# Patient Record
Sex: Female | Born: 1955 | Race: Black or African American | Hispanic: No | Marital: Married | Smoking: Never smoker
Health system: Southern US, Community
[De-identification: ages and names within clinical notes are randomized; demographics above are authoritative.]

## PROBLEM LIST (undated history)

## (undated) DIAGNOSIS — R7303 Prediabetes: Secondary | ICD-10-CM

## (undated) DIAGNOSIS — Z803 Family history of malignant neoplasm of breast: Secondary | ICD-10-CM

## (undated) DIAGNOSIS — T4145XA Adverse effect of unspecified anesthetic, initial encounter: Secondary | ICD-10-CM

## (undated) DIAGNOSIS — I739 Peripheral vascular disease, unspecified: Secondary | ICD-10-CM

## (undated) DIAGNOSIS — R519 Headache, unspecified: Secondary | ICD-10-CM

## (undated) DIAGNOSIS — E559 Vitamin D deficiency, unspecified: Secondary | ICD-10-CM

## (undated) DIAGNOSIS — H04123 Dry eye syndrome of bilateral lacrimal glands: Secondary | ICD-10-CM

## (undated) DIAGNOSIS — M1990 Unspecified osteoarthritis, unspecified site: Secondary | ICD-10-CM

## (undated) DIAGNOSIS — I2510 Atherosclerotic heart disease of native coronary artery without angina pectoris: Secondary | ICD-10-CM

## (undated) DIAGNOSIS — R0602 Shortness of breath: Secondary | ICD-10-CM

## (undated) DIAGNOSIS — E538 Deficiency of other specified B group vitamins: Secondary | ICD-10-CM

## (undated) DIAGNOSIS — M7989 Other specified soft tissue disorders: Secondary | ICD-10-CM

## (undated) DIAGNOSIS — F419 Anxiety disorder, unspecified: Secondary | ICD-10-CM

## (undated) DIAGNOSIS — Z800 Family history of malignant neoplasm of digestive organs: Secondary | ICD-10-CM

## (undated) DIAGNOSIS — G479 Sleep disorder, unspecified: Secondary | ICD-10-CM

## (undated) DIAGNOSIS — R112 Nausea with vomiting, unspecified: Secondary | ICD-10-CM

## (undated) DIAGNOSIS — R51 Headache: Secondary | ICD-10-CM

## (undated) DIAGNOSIS — L309 Dermatitis, unspecified: Secondary | ICD-10-CM

## (undated) DIAGNOSIS — K219 Gastro-esophageal reflux disease without esophagitis: Secondary | ICD-10-CM

## (undated) DIAGNOSIS — F329 Major depressive disorder, single episode, unspecified: Secondary | ICD-10-CM

## (undated) DIAGNOSIS — K5900 Constipation, unspecified: Secondary | ICD-10-CM

## (undated) DIAGNOSIS — F32A Depression, unspecified: Secondary | ICD-10-CM

## (undated) DIAGNOSIS — J189 Pneumonia, unspecified organism: Secondary | ICD-10-CM

## (undated) DIAGNOSIS — Z9889 Other specified postprocedural states: Secondary | ICD-10-CM

## (undated) DIAGNOSIS — E7800 Pure hypercholesterolemia, unspecified: Secondary | ICD-10-CM

## (undated) DIAGNOSIS — T8859XA Other complications of anesthesia, initial encounter: Secondary | ICD-10-CM

## (undated) HISTORY — DX: Constipation, unspecified: K59.00

## (undated) HISTORY — DX: Other specified soft tissue disorders: M79.89

## (undated) HISTORY — PX: OTHER SURGICAL HISTORY: SHX169

## (undated) HISTORY — DX: Deficiency of other specified B group vitamins: E53.8

## (undated) HISTORY — DX: Depression, unspecified: F32.A

## (undated) HISTORY — DX: Family history of malignant neoplasm of breast: Z80.3

## (undated) HISTORY — DX: Sleep disorder, unspecified: G47.9

## (undated) HISTORY — DX: Major depressive disorder, single episode, unspecified: F32.9

## (undated) HISTORY — DX: Prediabetes: R73.03

## (undated) HISTORY — DX: Unspecified osteoarthritis, unspecified site: M19.90

## (undated) HISTORY — DX: Vitamin D deficiency, unspecified: E55.9

## (undated) HISTORY — DX: Atherosclerotic heart disease of native coronary artery without angina pectoris: I25.10

## (undated) HISTORY — DX: Shortness of breath: R06.02

## (undated) HISTORY — DX: Family history of malignant neoplasm of digestive organs: Z80.0

## (undated) HISTORY — PX: KNEE SURGERY: SHX244

---

## 2005-10-24 ENCOUNTER — Other Ambulatory Visit: Admission: RE | Admit: 2005-10-24 | Discharge: 2005-10-24 | Payer: Self-pay | Admitting: Obstetrics and Gynecology

## 2005-11-09 ENCOUNTER — Encounter: Admission: RE | Admit: 2005-11-09 | Discharge: 2005-11-09 | Payer: Self-pay | Admitting: Obstetrics and Gynecology

## 2005-12-15 ENCOUNTER — Ambulatory Visit (HOSPITAL_COMMUNITY): Admission: RE | Admit: 2005-12-15 | Discharge: 2005-12-15 | Payer: Self-pay | Admitting: Obstetrics and Gynecology

## 2010-04-10 ENCOUNTER — Encounter: Payer: Self-pay | Admitting: Obstetrics and Gynecology

## 2010-05-30 ENCOUNTER — Other Ambulatory Visit (HOSPITAL_COMMUNITY)
Admission: RE | Admit: 2010-05-30 | Discharge: 2010-05-30 | Disposition: A | Discharge status: Home / Self Care | Payer: 59 | Source: Ambulatory Visit | Attending: Obstetrics and Gynecology | Admitting: Obstetrics and Gynecology

## 2010-05-30 ENCOUNTER — Other Ambulatory Visit: Payer: Self-pay | Admitting: Obstetrics and Gynecology

## 2010-05-30 DIAGNOSIS — Z01419 Encounter for gynecological examination (general) (routine) without abnormal findings: Secondary | ICD-10-CM | POA: Insufficient documentation

## 2011-01-13 ENCOUNTER — Encounter: Payer: Self-pay | Admitting: Family Medicine

## 2011-01-13 ENCOUNTER — Ambulatory Visit (INDEPENDENT_AMBULATORY_CARE_PROVIDER_SITE_OTHER): Payer: 59 | Admitting: Family Medicine

## 2011-01-13 ENCOUNTER — Ambulatory Visit: Payer: 59 | Admitting: Family Medicine

## 2011-01-13 VITALS — BP 113/66 | HR 77 | Temp 97.8°F | Ht 68.0 in | Wt 243.0 lb

## 2011-01-13 DIAGNOSIS — S99919A Unspecified injury of unspecified ankle, initial encounter: Secondary | ICD-10-CM

## 2011-01-13 DIAGNOSIS — S99912A Unspecified injury of left ankle, initial encounter: Secondary | ICD-10-CM

## 2011-01-13 DIAGNOSIS — S8990XA Unspecified injury of unspecified lower leg, initial encounter: Secondary | ICD-10-CM

## 2011-01-13 NOTE — Patient Instructions (Signed)
Your exam is most consistent with a medial ankle sprain, posterior tibialis strain/tendinopathy, stretch of tibial nerve on the inside part of your ankle that has not been rehabilitated to this point. Start physical therapy for exercises and modalities. Icing 15 minutes at a time 3-4 times a day. Try over the counter insoles with arch support to alleviate overpronation that causes strain of these areas. Aleve 1-2 tabs twice a day with food for pain and inflammation. A walking boot or lace-up ankle brace are considerations but I would not recommend those at this point. If you are not improving over the course of 1 month to 6 weeks, at that point I would move forward with an MRI to further assess. Follow- up with me in 1 month to 6 weeks for a recheck.

## 2011-01-16 ENCOUNTER — Encounter: Payer: Self-pay | Admitting: Family Medicine

## 2011-01-16 DIAGNOSIS — S99912A Unspecified injury of left ankle, initial encounter: Secondary | ICD-10-CM | POA: Insufficient documentation

## 2011-01-16 NOTE — Assessment & Plan Note (Signed)
Initial injury 6 months ago and patient still having medial pain though she has not rehabilitated this injury.  Initial x-rays negative and today's ultrasound do not show evidence of fracture or a nonunion.  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  Start physical therapy, arch supports, aleve, icing.  No gross instability so will hold off on using boot or ASO with focus on PT.  F/u in 1 month to 6 weeks.  If not improving would consider MRI to further assess persistent swelling, pain.

## 2011-01-16 NOTE — Progress Notes (Signed)
  Subjective:    Patient ID: Destiny Rocha, female    DOB: 03-28-1955, 55 y.o.   MRN: [id number]  PCP: Dr. Isabella Stalling  HPI 55 yo F here for left ankle injury.  Patient reports in April of this year she jumped out of the bed to catch her husband who was passing out when she everted her left ankle. She had difficulty bearing weight immediately following this. Sought care and had x-rays at [facility] that were negative for a fracture. Since took some anti-inflammatories, has been icing. Doing some range of motion exercises but has not done formal PT. + swelling Feels a burning sensation medial left ankle at times and knots in this area. No prior ankle injuries.  History reviewed. No pertinent past medical history.  No current outpatient prescriptions on file prior to visit.    Past Surgical History  Procedure Date  . Knee surgery     No Known Allergies  History   Social History  . Marital Status: Married    Spouse Name: N/A    Number of Children: N/A  . Years of Education: N/A   Occupational History  . Not on file.   Social History Main Topics  . Smoking status: Never Smoker   . Smokeless tobacco: Not on file  . Alcohol Use: Not on file  . Drug Use: Not on file  . Sexually Active: Not on file   Other Topics Concern  . Not on file   Social History Narrative  . No narrative on file    No family history on file.  BP 113/66  Pulse 77  Temp(Src) 97.8 F (36.6 C) (Oral)  Ht 5\' 8"  (1.727 m)  Wt 243 lb (110.224 kg)  BMI 36.95 kg/m2  Review of Systems See HPI above.    Objective:   Physical Exam Gen: NAD  L ankle: Mild edema throughout ankle but similar to right ankle.  No other gross deformity, ecchymoses. FROM with pain on full external and internal rotation - pain in medial ankle. 4/5 strength with IR, 5/5 all other motions. TTP posterior aspect medial malleolus, through post tib tendon course, and around deltoid ligament.  No lat malleolus, base 5th,  navicular, fibular head, or other TTP about ankle/lower leg. Negative ant drawer and talar tilt.   Negative syndesmotic compression. Thompsons test negative. NV intact distally. Negative tinels tarsal tunnel.  MSK u/s: No evidence bony abnormality, cortical edema, or neovascularity over bony cortex of medial/lateral malleoli, base 5th MT.  Peroneal tendons and posterior tibialis tendon appear intact without partial tears or neovascularity.  Talar dome appears normal.    Assessment & Plan:  1. Left ankle injury - Initial injury 6 months ago and patient still having medial pain though she has not rehabilitated this injury.  Initial x-rays negative and today's ultrasound do not show evidence of fracture or a nonunion.  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  Start physical therapy, arch supports, aleve, icing.  No gross instability so will hold off on using boot or ASO with focus on PT.  F/u in 1 month to 6 weeks.  If not improving would consider MRI to further assess persistent swelling, pain.

## 2011-11-23 ENCOUNTER — Emergency Department (HOSPITAL_COMMUNITY)
Admission: EM | Admit: 2011-11-23 | Discharge: 2011-11-24 | Disposition: A | Discharge status: Home / Self Care | Payer: 59 | Attending: Emergency Medicine | Admitting: Emergency Medicine

## 2011-11-23 ENCOUNTER — Emergency Department (HOSPITAL_COMMUNITY): Payer: 59

## 2011-11-23 DIAGNOSIS — R0789 Other chest pain: Secondary | ICD-10-CM

## 2011-11-23 DIAGNOSIS — R079 Chest pain, unspecified: Secondary | ICD-10-CM | POA: Insufficient documentation

## 2011-11-23 MED ORDER — KETOROLAC TROMETHAMINE 30 MG/ML IJ SOLN: 30.0000 mg | Freq: Once | INTRAMUSCULAR | Status: DC

## 2011-11-23 NOTE — ED Provider Notes (Signed)
History     CSN: [id number]  Arrival date & time 11/23/11  2256   First MD Initiated Contact with Patient 11/23/11 2320      Chief Complaint  Patient presents with  . Chest Pain    (Consider location/radiation/quality/duration/timing/severity/associated sxs/prior treatment) Patient is a 56 y.o. female presenting with chest pain. The history is provided by the patient.  Chest Pain The chest pain began yesterday. Duration of episode(s) is 13 hours. Chest pain occurs constantly. The chest pain is unchanged. Associated with: being still. At its most intense, the pain is at 9/10. The pain is currently at 9/10. The severity of the pain is severe. The quality of the pain is described as dull. Radiates to: movement makes pain better. Exacerbated by: nothing. Primary symptoms include cough. Pertinent negatives for primary symptoms include no fever, no fatigue, no nausea, no vomiting and no dizziness.  Pertinent negatives for associated symptoms include no claudication, no diaphoresis and no weakness. She tried aspirin for the symptoms. Risk factors include obesity.  Pertinent negatives for past medical history include no MI.  Her family medical history is significant for CAD in family.  Procedure history is negative for cardiac catheterization.     No past medical history on file.  Past Surgical History  Procedure Date  . Knee surgery     No family history on file.  History  Substance Use Topics  . Smoking status: Never Smoker   . Smokeless tobacco: Not on file  . Alcohol Use: Not on file    OB History    Grav Para Term Preterm Abortions TAB SAB Ect Mult Living                  Review of Systems  Constitutional: Negative for fever, diaphoresis and fatigue.  HENT: Negative for neck pain.   Respiratory: Positive for cough.   Cardiovascular: Positive for chest pain. Negative for claudication and leg swelling.  Gastrointestinal: Negative for nausea and vomiting.  Neurological:  Negative for dizziness and weakness.  All other systems reviewed and are negative.    Allergies  Review of patient's allergies indicates no known allergies.  Home Medications  No current outpatient prescriptions on file.  BP 126/78  Pulse 75  Resp 16  Ht 5\' 8"  (1.727 m)  Wt 250 lb (113.399 kg)  BMI 38.01 kg/m2  SpO2 100%  Physical Exam  Constitutional: She is oriented to person, place, and time. She appears well-developed and well-nourished. No distress.  HENT:  Head: Normocephalic and atraumatic.  Mouth/Throat: Oropharynx is clear and moist.  Eyes: EOM are normal. Pupils are equal, round, and reactive to light.  Neck: Normal range of motion. Neck supple.  Cardiovascular: Normal rate and regular rhythm.   Pulmonary/Chest: Effort normal and breath sounds normal. She has no wheezes. She has no rales. She exhibits tenderness.  Abdominal: Soft. Bowel sounds are normal. There is no tenderness. There is no rebound and no guarding.  Musculoskeletal: Normal range of motion.  Neurological: She is alert and oriented to person, place, and time.  Skin: Skin is warm and dry.  Psychiatric: She has a normal mood and affect.    ED Course  Procedures (including critical care time)   Labs Reviewed  CBC WITH DIFFERENTIAL  D-DIMER, QUANTITATIVE   No results found.   No diagnosis found.    MDM   Date: 11/23/2011  Rate:69  Rhythm: normal sinus rhythm  QRS Axis: normal  Intervals: normal  ST/T Wave abnormalities: normal  Conduction Disutrbances: none  Narrative Interpretation: unremarkable     Doubt cardiac etiology, appear musculoskeletal as pain is reproducible and better with motion.  Moreover in the setting of > 8 hours of continuous pain with one negative EKG and one negative troponin is sufficient to exclude acs.  Follow up with your family doctor return for ongoing care      Yalda Herd K Boy Delamater-Rasch, MD 11/24/11 0126

## 2011-11-23 NOTE — ED Notes (Signed)
Pt reports mid chest pain since last night. Pain radiates to upper back. Pt reports shortness of breath, mild sweating at night. Denies n/v.

## 2011-11-24 ENCOUNTER — Encounter (HOSPITAL_COMMUNITY): Payer: Self-pay

## 2011-11-24 LAB — POCT I-STAT, CHEM 8
Calcium, Ion: 1.13 mmol/L (ref 1.12–1.23)
Glucose, Bld: 98 mg/dL (ref 70–99)
HCT: 40 % (ref 36.0–46.0)
Hemoglobin: 13.6 g/dL (ref 12.0–15.0)
Potassium: 4.1 mEq/L (ref 3.5–5.1)

## 2011-11-24 LAB — CBC WITH DIFFERENTIAL/PLATELET
Basophils Absolute: 0 10*3/uL (ref 0.0–0.1)
Basophils Relative: 0 % (ref 0–1)
Eosinophils Relative: 2 % (ref 0–5)
HCT: 38.8 % (ref 36.0–46.0)
MCHC: 35.3 g/dL (ref 30.0–36.0)
Monocytes Absolute: 0.5 10*3/uL (ref 0.1–1.0)
Neutro Abs: 3.1 10*3/uL (ref 1.7–7.7)
Platelets: 247 10*3/uL (ref 150–400)
RDW: 13.4 % (ref 11.5–15.5)

## 2011-11-24 MED ORDER — TRAMADOL HCL 50 MG PO TABS: 50.0000 mg | ORAL_TABLET | Freq: Four times a day (QID) | ORAL | Status: DC | PRN

## 2011-11-24 MED ORDER — IBUPROFEN 600 MG PO TABS: 600.0000 mg | ORAL_TABLET | Freq: Four times a day (QID) | ORAL | Status: AC | PRN

## 2011-11-24 NOTE — ED Notes (Signed)
Pt refused tramadol.  Went back to md.  Pt given script for ibuprofen.  Pt refused ibuprofen.  States she will go to primary md.  Neither med an allergy.  Pt states does not work for her pain and may cause GI upset.

## 2011-12-27 ENCOUNTER — Other Ambulatory Visit: Payer: Self-pay | Admitting: Family Medicine

## 2011-12-27 DIAGNOSIS — M79609 Pain in unspecified limb: Secondary | ICD-10-CM

## 2011-12-29 ENCOUNTER — Ambulatory Visit
Admission: RE | Admit: 2011-12-29 | Discharge: 2011-12-29 | Disposition: A | Discharge status: Home / Self Care | Payer: 59 | Source: Ambulatory Visit | Attending: Family Medicine | Admitting: Family Medicine

## 2011-12-29 ENCOUNTER — Other Ambulatory Visit: Payer: 59

## 2011-12-29 DIAGNOSIS — M79609 Pain in unspecified limb: Secondary | ICD-10-CM

## 2013-09-08 ENCOUNTER — Encounter: Payer: Self-pay | Admitting: Sports Medicine

## 2013-09-08 ENCOUNTER — Ambulatory Visit (INDEPENDENT_AMBULATORY_CARE_PROVIDER_SITE_OTHER): Payer: Managed Care, Other (non HMO) | Admitting: Sports Medicine

## 2013-09-08 VITALS — BP 123/82 | HR 84 | Ht 68.0 in | Wt 268.8 lb

## 2013-09-08 DIAGNOSIS — M170 Bilateral primary osteoarthritis of knee: Secondary | ICD-10-CM | POA: Insufficient documentation

## 2013-09-08 DIAGNOSIS — M1710 Unilateral primary osteoarthritis, unspecified knee: Secondary | ICD-10-CM

## 2013-09-08 MED ORDER — MELOXICAM 15 MG PO TABS: ORAL_TABLET | ORAL | Status: DC

## 2013-09-08 NOTE — Assessment & Plan Note (Addendum)
Patient does desire to proceed conservatively. She can do our free fitness programs downstairs, she is thinking about pursuing weight loss management either with me, with the bariatric center up the road which I think is acceptable. If she decides to proceed with [name] I am happy to prescribe phentermine, give her an exercise prescription, and have her see a nutritionist. I have also offered custom orthotics. She will think about this and return to see me as needed. Injection would be the next step. She will take Mobic daily.

## 2013-09-08 NOTE — Assessment & Plan Note (Signed)
We did discuss her current weight loss plans, she is seeing a bariatric center and getting phentermine. I advised her I be happy to add nutrition referral, and that there were exercise classes for free downstairs. She will let me know whether she would like to utilize any of these services or would want me to take over prescription of her phentermine.

## 2013-09-08 NOTE — Progress Notes (Signed)
   Subjective:    I'm seeing this patient as a consultation for:  Dr. Orland Penman  CC: Bilateral knee pain  HPI: This is a pleasant 58 year old female with a long history of knee osteoarthritis, she is morbidly obese. Pain is moderate, persistent, localized in the posterior medial joint line. She has been told by other providers that she has a Child psychotherapist cyst, but this is never been confirmed with imaging. Pain is moderate, persistent, no mechanical symptoms.  Past medical history, Surgical history, Family history not pertinant except as noted below, Social history, Allergies, and medications have been entered into the medical record, reviewed, and no changes needed.   Review of Systems: No headache, visual changes, nausea, vomiting, diarrhea, constipation, dizziness, abdominal pain, skin rash, fevers, chills, night sweats, weight loss, swollen lymph nodes, body aches, joint swelling, muscle aches, chest pain, shortness of breath, mood changes, visual or auditory hallucinations.   Objective:   General: Well Developed, well nourished, and in no acute distress.  Neuro/Psych: Alert and oriented x3, extra-ocular muscles intact, able to move all 4 extremities, sensation grossly intact. Skin: Warm and dry, no rashes noted.  Respiratory: Not using accessory muscles, speaking in full sentences, trachea midline.  Cardiovascular: Pulses palpable, no extremity edema. Abdomen: Does not appear distended. Bilateral Knee: Normal to inspection with no erythema or effusion or obvious bony abnormalities. Palpation at the joint lines medially on both knees. ROM full in flexion and extension and lower leg rotation. Ligaments with solid consistent endpoints including ACL, PCL, LCL, MCL. Negative Mcmurray's, Apley's, and Thessalonian tests. Non painful patellar compression. Patellar glide without crepitus. Patellar and quadriceps tendons unremarkable. Hamstring and quadriceps strength is normal.   Impression and  Recommendations:   This case required medical decision making of moderate complexity.

## 2013-09-16 ENCOUNTER — Other Ambulatory Visit: Payer: Self-pay | Admitting: Radiology

## 2013-10-06 ENCOUNTER — Encounter (INDEPENDENT_AMBULATORY_CARE_PROVIDER_SITE_OTHER): Payer: Self-pay | Admitting: General Surgery

## 2013-10-06 ENCOUNTER — Ambulatory Visit (INDEPENDENT_AMBULATORY_CARE_PROVIDER_SITE_OTHER): Payer: Managed Care, Other (non HMO) | Admitting: General Surgery

## 2013-10-06 VITALS — BP 116/80 | HR 80 | Resp 16 | Ht 68.0 in | Wt 267.2 lb

## 2013-10-06 DIAGNOSIS — D242 Benign neoplasm of left breast: Secondary | ICD-10-CM

## 2013-10-06 DIAGNOSIS — D249 Benign neoplasm of unspecified breast: Secondary | ICD-10-CM

## 2013-10-06 NOTE — Progress Notes (Addendum)
Patient ID: Destiny Rocha, female   DOB: 1955-12-21, 58 y.o.   MRN: [id number]  Chief Complaint  Patient presents with  . Breast Mass    new pt- eval papilloma    HPI Destiny Rocha is a 58 y.o. female.   HPI  She is referred by Dr. Marcelo Baldy for further evaluation and treatment of a left breast intraductal papilloma.  She was noted to have an abnormality in the left breast on a screening mammogram. A 9 mm mass was noted deep to the nipple. Image guided biopsy was performed. Pathology demonstrated an intraductal papilloma with no atypia. No family history of breast cancer. Age at menarche was 35. Age at first live childbirth was 89. Menopause at age 41. No hormone replacement therapy.  Her husband is here with her.  Past Medical History  Diagnosis Date  . Arthritis   . Osteoporosis     Past Surgical History  Procedure Laterality Date  . Knee surgery    . Cesarean section      History reviewed. No pertinent family history.  Social History History  Substance Use Topics  . Smoking status: Never Smoker   . Smokeless tobacco: Not on file  . Alcohol Use: Yes    No Known Allergies  Current Outpatient Prescriptions  Medication Sig Dispense Refill  . b complex vitamins tablet Take by mouth.      . capsicum (CAPSAICIN APR) 0.075 % topical cream Apply topically.      . Cholecalciferol (VITAMIN D PO) Take by mouth.      . IRON PO Take by mouth.      . meloxicam (MOBIC) 15 MG tablet One tab PO qAM with breakfast for 2 weeks, then daily prn pain.  30 tablet  3  . Multiple Vitamin (MULTIVITAMIN) tablet Take 1 tablet by mouth daily.      . phentermine 37.5 MG capsule Take 37.5 mg by mouth every morning.       No current facility-administered medications for this visit.    Review of Systems Review of Systems  Constitutional: Negative.   HENT: Negative.   Respiratory: Negative.   Cardiovascular: Negative.   Gastrointestinal: Positive for constipation.  Genitourinary:  Negative.   Musculoskeletal: Positive for arthralgias.  Hematological: Bruises/bleeds easily.    Blood pressure 116/80, pulse 80, resp. rate 16, height 5\' 8"  (1.727 m), weight 267 lb 3.2 oz (121.201 kg).  Physical Exam Physical Exam  Constitutional: No distress.  Obese female  HENT:  Head: Normocephalic and atraumatic.  Eyes: No scleral icterus.  Pulmonary/Chest:  Breasts are symmetrical in size. The left breast there is a small scar superiorly. No palpable masses present. Right breast is soft without any palpable dominant masses or suspicious skin changes.  No supraclavicular adenopathy  Musculoskeletal:  No axillary adenopathy  Lymphadenopathy:    She has no cervical adenopathy.    Data Reviewed Mammogram. Mammogram report. Biopsy report.  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 No atypia. We talked about options for treatment including excision versus following the lesion which some people suggested is a reasonable alternative.     Plan    I did discuss the procedure and risks of left breast lumpectomy after wire or radioactive seed localization.I have explained the procedure, risks, and aftercare to her.  Risks include but are not limited to bleeding, infection, wound problems, anesthesia.  She seems to understand.  She would like to think about it and call us back  with her decision.       Destiny Rocha J 10/06/2013, 3:17 PM

## 2013-10-06 NOTE — Patient Instructions (Signed)
Call us when you decide how you would like to proceed-follow up or surgery.  [phone].

## 2013-10-13 ENCOUNTER — Encounter (INDEPENDENT_AMBULATORY_CARE_PROVIDER_SITE_OTHER): Payer: Self-pay

## 2014-01-15 ENCOUNTER — Telehealth: Payer: Self-pay | Admitting: *Deleted

## 2014-01-15 NOTE — Telephone Encounter (Signed)
I haven't seen her for months, next step would be a standard triamcinolone injection and custom orthotics. Have her set up a follow-up with me to drain and inject the knees.

## 2014-01-15 NOTE — Telephone Encounter (Signed)
Destiny Rocha calls today due to increased knee pain bilaterally but she said her left knee is more bothersome, achy, hot and swollen. She reports that she thinks possible there is fluid on knee. She is taking the mobic one time daily and was questioning aleve or anything else. I told her that she should stay off her feet as much that is possible and apply ice to area. I also told her that she can use tylenol to try and bridge pain control. She is scheduled to see you Tuesday. Do you want me to start working on Orthovisc? She has Airline pilot? Please advise. [name], CMA

## 2014-01-16 ENCOUNTER — Emergency Department (INDEPENDENT_AMBULATORY_CARE_PROVIDER_SITE_OTHER)
Admission: EM | Admit: 2014-01-16 | Discharge: 2014-01-16 | Disposition: A | Discharge status: Home / Self Care | Payer: Managed Care, Other (non HMO) | Source: Home / Self Care | Attending: Emergency Medicine | Admitting: Emergency Medicine

## 2014-01-16 ENCOUNTER — Encounter: Payer: Self-pay | Admitting: Emergency Medicine

## 2014-01-16 ENCOUNTER — Emergency Department (INDEPENDENT_AMBULATORY_CARE_PROVIDER_SITE_OTHER): Payer: Managed Care, Other (non HMO)

## 2014-01-16 DIAGNOSIS — M25562 Pain in left knee: Secondary | ICD-10-CM

## 2014-01-16 DIAGNOSIS — M179 Osteoarthritis of knee, unspecified: Secondary | ICD-10-CM

## 2014-01-16 DIAGNOSIS — S8392XA Sprain of unspecified site of left knee, initial encounter: Secondary | ICD-10-CM

## 2014-01-16 MED ORDER — MELOXICAM 15 MG PO TABS
15.0000 mg | ORAL_TABLET | Freq: Every day | ORAL | Status: DC
Start: 2014-01-16 — End: 2014-01-21

## 2014-01-16 MED ORDER — HYDROCODONE-ACETAMINOPHEN 5-325 MG PO TABS: 1.0000 | ORAL_TABLET | ORAL | Status: DC | PRN

## 2014-01-16 NOTE — ED Notes (Signed)
Reports chronic bilateral knee pain; numerous evaluations and treatments over past 25 years; has been seen by Dr.T in recent years; has appointment with him next week but cannot tolerate the pain until then.Yesterday was unable to function at work due to both knees hurting. Took Aleve at 0700 today.

## 2014-01-16 NOTE — ED Provider Notes (Signed)
CSN: [id number]     Arrival date & time 01/16/14  1831 History   First MD Initiated Contact with Patient 01/16/14 1842     Chief Complaint  Patient presents with  . Knee Pain   Patient presents to [facility] urgent care Friday evening. Patient of Dr. Dianah Field, has appointment with him in 4 days HPI Reports chronic bilateral knee pain; numerous evaluations and treatments over past 25 years; has been seen by Dr.T in recent years; has appointment with him next week but she states that she cannot tolerate the pain until then.Yesterday was unable to function at work due to both knees hurting. Took Aleve at 0700 today, minimal relief. When she got up from bed this morning she felt a pop in left knee and the pain worsened. Sharp, 8 out of 10 with movement. No radiation. She can weight-bear but with severe pain.  She has long-standing history of bilateral leg edema, denies any worsening calf edema. Denies calf pain.   In 12/2011, ultrasound right leg negative for DVT. Past Medical History  Diagnosis Date  . Arthritis   . Osteoporosis    Past Surgical History  Procedure Laterality Date  . Knee surgery    . Cesarean section     History reviewed. No pertinent family history. History  Substance Use Topics  . Smoking status: Never Smoker   . Smokeless tobacco: Not on file  . Alcohol Use: Yes   OB History   Grav Para Term Preterm Abortions TAB SAB Ect Mult Living                 Review of Systems  Respiratory: Negative for chest tightness and shortness of breath.   Cardiovascular: Negative for chest pain and palpitations.  All other systems reviewed and are negative.   Allergies  Review of patient's allergies indicates no known allergies.  Home Medications   Prior to Admission medications   Medication Sig Start Date End Date Taking? Authorizing Provider  b complex vitamins tablet Take by mouth.    Historical Provider, MD  capsicum (CAPSAICIN APR) 0.075 % topical cream  Apply topically. 08/16/07   Historical Provider, MD  Cholecalciferol (VITAMIN D PO) Take by mouth.    Historical Provider, MD  HYDROcodone-acetaminophen (NORCO/VICODIN) 5-325 MG per tablet Take 1-2 tablets by mouth every 4 (four) hours as needed for severe pain. Take with food. 01/16/14   Jacqulyn Cane, MD  IRON PO Take by mouth.    Historical Provider, MD  meloxicam (MOBIC) 15 MG tablet One tab PO qAM with breakfast for 2 weeks, then daily prn pain. 09/08/13   Silverio Decamp, MD  meloxicam (MOBIC) 15 MG tablet Take 1 tablet (15 mg total) by mouth daily. As needed for pain and inflammation. Take with food. 01/16/14   Jacqulyn Cane, MD  Multiple Vitamin (MULTIVITAMIN) tablet Take 1 tablet by mouth daily.    Historical Provider, MD  phentermine 37.5 MG capsule Take 37.5 mg by mouth every morning.    Historical Provider, MD   BP 128/83  Pulse 77  Temp(Src) 98.1 F (36.7 C) (Oral)  Resp 16  Ht 5\' 8"  (1.727 m)  Wt 255 lb (115.667 kg)  BMI 38.78 kg/m2  SpO2 99% Physical Exam  Nursing note and vitals reviewed. Constitutional: She is oriented to person, place, and time. She appears well-developed and well-nourished. No distress.  Very obese female sitting in wheelchair. Uncomfortable from left knee pain.  HENT:  Head: Normocephalic and atraumatic.  Eyes: Conjunctivae and  EOM are normal. Pupils are equal, round, and reactive to light. No scleral icterus.  Neck: Normal range of motion.  Cardiovascular: Normal rate.   Pulmonary/Chest: Effort normal.  Abdominal: She exhibits no distension.  Musculoskeletal: Normal range of motion.       Left knee: Tenderness found. Medial joint line and lateral joint line tenderness noted.  Right knee: Chronic arthritic changes. Trace pretibial edema. No cords or calf tenderness. Negative Homans sign. Mild diffuse tenderness.  Left knee:Chronic arthritic changes. Trace pretibial edema. No cords or calf tenderness. Negative Homans sign. Severe diffuse  tenderness.--There is mild left popliteal soft tissue/adipose tenderness and fullness but no definite cords or solid mass or heat or fluctuance.  Neurovascular distally lower extremities intact. Sensation intact. Motor intact   Neurological: She is alert and oriented to person, place, and time.  Skin: Skin is warm.  Psychiatric: She has a normal mood and affect.    ED Course  Procedures (including critical care time) Labs Review Labs Reviewed - No data to display  Imaging Review EXAM:  LEFT KNEE - COMPLETE 4+ VIEW  COMPARISON: None.  FINDINGS:  There is medial compartment and patellofemoral compartment narrowing  noted. There is tricompartment marginal spur formation and  subchondral sclerosis. There is no fracture or subluxation. No joint  effusion.  IMPRESSION:  1. Moderate tricompartment osteoarthritis.  Electronically Signed  By: Kerby Moors M.D.  On: 01/16/2014 19:55   MDM   1. Left knee pain   2. Left knee sprain, initial encounter     Reviewed with her and husband that x-rays left knee show significant degenerative changes. No acute fracture. Clinically, no sign of DVT on physical exam, but advised her to go to emergency room stat if any worsening swelling of legs or calf pain or other red flags. She and husband voiced understanding and agreement.  Treatment options discussed, as well as risks, benefits, alternatives. Patient and husband voiced understanding and agreement with the following plans:   New Prescriptions   HYDROCODONE-ACETAMINOPHEN (NORCO/VICODIN) 5-325 MG PER TABLET    Take 1-2 tablets by mouth every 4 (four) hours as needed for severe pain. Take with food.   MELOXICAM (MOBIC) 15 MG TABLET    Take 1 tablet (15 mg total) by mouth daily. As needed for pain and inflammation. Take with food.   6 inch Ace applied left knee. Crutches. Follow-up with  Dr T, appointment in 4 days Precautions discussed. Red flags discussed at length with patient and  husband.--Go to emergency room if any red flag Questions invited and answered.  Patient and husband voiced understanding and agreement.   Jacqulyn Cane, MD 01/16/14 2003

## 2014-01-16 NOTE — Telephone Encounter (Signed)
She is scheduled to see you on Tuesday, November 2nd.

## 2014-01-19 ENCOUNTER — Ambulatory Visit: Payer: Managed Care, Other (non HMO) | Admitting: Sports Medicine

## 2014-01-21 ENCOUNTER — Encounter: Payer: Self-pay | Admitting: Sports Medicine

## 2014-01-21 ENCOUNTER — Ambulatory Visit (INDEPENDENT_AMBULATORY_CARE_PROVIDER_SITE_OTHER): Payer: Managed Care, Other (non HMO) | Admitting: Sports Medicine

## 2014-01-21 DIAGNOSIS — M170 Bilateral primary osteoarthritis of knee: Secondary | ICD-10-CM

## 2014-01-21 MED ORDER — DICLOFENAC SODIUM 75 MG PO TBEC: 75.0000 mg | DELAYED_RELEASE_TABLET | Freq: Two times a day (BID) | ORAL | Status: DC

## 2014-01-21 MED ORDER — GLUCOSAMINE-CHONDROITIN 500-400 MG PO CAPS: ORAL_CAPSULE | ORAL | Status: DC

## 2014-01-21 NOTE — Assessment & Plan Note (Addendum)
Bilateral left worse than right knee osteoarthritis. Has had viscous supplementation the past, last in a month. Formal physical therapy, Voltaren, return for custom orthotics. Cosamin. Discuss weight loss therapy with PCP.

## 2014-01-21 NOTE — Progress Notes (Signed)
  Subjective:    CC: knee osteoarthritis  HPI: This is a very pleasant 58 year old female, for decades now she's had pain in both knees, left worse than right at the medial joint line, moderate, persistent without radiation. She has been through [employer] supplementation in the past by different provider, this worked for approximately one month. She is here to seek a noninvasive way to treat her knee osteoarthritis.  Past medical history, Surgical history, Family history not pertinant except as noted below, Social history, Allergies, and medications have been entered into the medical record, reviewed, and no changes needed.   Review of Systems: No fevers, chills, night sweats, weight loss, chest pain, or shortness of breath.   Objective:    General: Well Developed, well nourished, and in no acute distress.  Neuro: Alert and oriented x3, extra-ocular muscles intact, sensation grossly intact.  HEENT: Normocephalic, atraumatic, pupils equal round reactive to light, neck supple, no masses, no lymphadenopathy, thyroid nonpalpable.  Skin: Warm and dry, no rashes. Cardiac: Regular rate and rhythm, no murmurs rubs or gallops, no lower extremity edema.  Respiratory: Clear to auscultation bilaterally. Not using accessory muscles, speaking in full sentences. Bilateral Knee: Normal to inspection with no erythema or effusion or obvious bony abnormalities. Tender to palpation at the medial joint line bilaterally. ROM normal in flexion and extension and lower leg rotation. Ligaments with solid consistent endpoints including ACL, PCL, LCL, MCL. Negative Mcmurray's and provocative meniscal tests. Non painful patellar compression. Patellar and quadriceps tendons unremarkable. Hamstring and quadriceps strength is normal.  Impression and Recommendations:

## 2014-01-27 ENCOUNTER — Ambulatory Visit: Payer: Managed Care, Other (non HMO) | Attending: Physical Therapy | Admitting: Physical Therapy

## 2014-01-27 DIAGNOSIS — M25561 Pain in right knee: Secondary | ICD-10-CM | POA: Diagnosis not present

## 2014-01-27 DIAGNOSIS — M25562 Pain in left knee: Secondary | ICD-10-CM | POA: Diagnosis not present

## 2014-01-27 DIAGNOSIS — Z5189 Encounter for other specified aftercare: Secondary | ICD-10-CM | POA: Diagnosis present

## 2014-02-19 ENCOUNTER — Ambulatory Visit (INDEPENDENT_AMBULATORY_CARE_PROVIDER_SITE_OTHER): Payer: Managed Care, Other (non HMO) | Admitting: Sports Medicine

## 2014-02-19 ENCOUNTER — Encounter: Payer: Self-pay | Admitting: Sports Medicine

## 2014-02-19 VITALS — BP 132/78 | HR 93 | Ht 68.0 in | Wt 269.0 lb

## 2014-02-19 DIAGNOSIS — M170 Bilateral primary osteoarthritis of knee: Secondary | ICD-10-CM

## 2014-02-19 NOTE — Assessment & Plan Note (Addendum)
Custom orthotics as above. Continue weight-loss treatments with PCP, other options that could be used concomittantly with phentermine include Contrave and Belviq. Certainly we could also consider referral for surgical weight loss. Again, she has failed viscous supple mentation, and steroid injections as well as topical and oral NSAIDs. Return in one month.

## 2014-02-19 NOTE — Assessment & Plan Note (Signed)
Starting phentermine with PCP.

## 2014-02-19 NOTE — Progress Notes (Signed)

## 2014-02-26 ENCOUNTER — Telehealth: Payer: Self-pay | Admitting: *Deleted

## 2014-02-26 ENCOUNTER — Ambulatory Visit (INDEPENDENT_AMBULATORY_CARE_PROVIDER_SITE_OTHER): Payer: Managed Care, Other (non HMO) | Admitting: Sports Medicine

## 2014-02-26 ENCOUNTER — Encounter: Payer: Self-pay | Admitting: Sports Medicine

## 2014-02-26 VITALS — BP 141/84 | HR 83 | Wt 267.0 lb

## 2014-02-26 DIAGNOSIS — M170 Bilateral primary osteoarthritis of knee: Secondary | ICD-10-CM

## 2014-02-26 NOTE — Progress Notes (Signed)
  Subjective:    CC: Follow-up  HPI: Bilateral knee osteoarthritis: Per patient she has not had injections for approximately 20 years. She is now having recurrence of left knee pain. Orthotics are doing well, but don't fit in many of her shoes. Pain is moderate, persistent.  Past medical history, Surgical history, Family history not pertinant except as noted below, Social history, Allergies, and medications have been entered into the medical record, reviewed, and no changes needed.   Review of Systems: No fevers, chills, night sweats, weight loss, chest pain, or shortness of breath.   Objective:    General: Well Developed, well nourished, and in no acute distress.  Neuro: Alert and oriented x3, extra-ocular muscles intact, sensation grossly intact.  HEENT: Normocephalic, atraumatic, pupils equal round reactive to light, neck supple, no masses, no lymphadenopathy, thyroid nonpalpable.  Skin: Warm and dry, no rashes. Cardiac: Regular rate and rhythm, no murmurs rubs or gallops, no lower extremity edema.  Respiratory: Clear to auscultation bilaterally. Not using accessory muscles, speaking in full sentences. Left Knee: Slightly swollen, palpable fluid wave. ROM normal in flexion and extension and lower leg rotation. Ligaments with solid consistent endpoints including ACL, PCL, LCL, MCL. Negative Mcmurray's and provocative meniscal tests. Non painful patellar compression. Patellar and quadriceps tendons unremarkable. Hamstring and quadriceps strength is normal.  Procedure: Real-time Ultrasound Guided Injection of left knee Device: GE Logiq E  Verbal informed consent obtained.  Time-out conducted.  Noted no overlying erythema, induration, or other signs of local infection.  Skin prepped in a sterile fashion.  Local anesthesia: Topical Ethyl chloride.  With sterile technique and under real time ultrasound guidance:  Noted multiple masses, circular, isoechoic with surrounding fatty  tissue, and without acoustic shadowing in the suprapatellar recess, I injected 2 mL kenalog 40, 4 mL lidocaine easily. Completed without difficulty  Pain immediately resolved suggesting accurate placement of the medication.  Advised to call if fevers/chills, erythema, induration, drainage, or persistent bleeding.  Images permanently stored and available for review in the ultrasound unit.  Impression: Technically successful ultrasound guided injection, intra-articular masses recommended MRI for further evaluation.  Impression and Recommendations:

## 2014-02-26 NOTE — Assessment & Plan Note (Signed)
Doing well with custom orthotics and Voltaren. She is having a flare of pain in her left knee with swelling. She tells me that her last injections including viscous supplementation was 20 years ago. Injected today. Attempted aspiration however there did appear to be multiple intra-articular bodies, that were fairly soft in appearance. For this reason we are going to obtain an MRI of her left knee. We will do all of this before proceeding with any form of Visco supplementation. Return to go over MRI results.

## 2014-02-26 NOTE — Telephone Encounter (Signed)
[facility] approval [id number] expires 05/27/14. [name], CMA

## 2014-03-16 ENCOUNTER — Other Ambulatory Visit: Payer: Managed Care, Other (non HMO)

## 2014-03-18 ENCOUNTER — Ambulatory Visit (HOSPITAL_COMMUNITY)
Admission: RE | Admit: 2014-03-18 | Discharge: 2014-03-18 | Disposition: A | Discharge status: Home / Self Care | Payer: Managed Care, Other (non HMO) | Source: Ambulatory Visit | Attending: Sports Medicine | Admitting: Sports Medicine

## 2014-03-18 DIAGNOSIS — M25562 Pain in left knee: Secondary | ICD-10-CM | POA: Insufficient documentation

## 2014-03-18 DIAGNOSIS — M25462 Effusion, left knee: Secondary | ICD-10-CM | POA: Insufficient documentation

## 2014-03-18 DIAGNOSIS — M179 Osteoarthritis of knee, unspecified: Secondary | ICD-10-CM | POA: Insufficient documentation

## 2014-03-18 DIAGNOSIS — M7989 Other specified soft tissue disorders: Secondary | ICD-10-CM | POA: Diagnosis present

## 2014-03-18 DIAGNOSIS — M25762 Osteophyte, left knee: Secondary | ICD-10-CM | POA: Insufficient documentation

## 2014-03-18 DIAGNOSIS — M170 Bilateral primary osteoarthritis of knee: Secondary | ICD-10-CM

## 2014-03-18 DIAGNOSIS — M23204 Derangement of unspecified medial meniscus due to old tear or injury, left knee: Secondary | ICD-10-CM | POA: Diagnosis not present

## 2014-03-18 DIAGNOSIS — R531 Weakness: Secondary | ICD-10-CM | POA: Diagnosis present

## 2014-03-18 DIAGNOSIS — M25862 Other specified joint disorders, left knee: Secondary | ICD-10-CM | POA: Diagnosis not present

## 2014-03-23 ENCOUNTER — Encounter: Payer: Self-pay | Admitting: Sports Medicine

## 2014-03-23 ENCOUNTER — Ambulatory Visit (INDEPENDENT_AMBULATORY_CARE_PROVIDER_SITE_OTHER): Payer: 59 | Admitting: Sports Medicine

## 2014-03-23 ENCOUNTER — Other Ambulatory Visit: Payer: Managed Care, Other (non HMO)

## 2014-03-23 ENCOUNTER — Ambulatory Visit: Payer: Managed Care, Other (non HMO) | Admitting: Sports Medicine

## 2014-03-23 VITALS — BP 120/73 | HR 76 | Ht 68.0 in | Wt 272.0 lb

## 2014-03-23 DIAGNOSIS — M170 Bilateral primary osteoarthritis of knee: Secondary | ICD-10-CM

## 2014-03-23 NOTE — Assessment & Plan Note (Signed)
MRI did show degenerative meniscal tearing and tricompartmental osteoarthritis as expected. Left knee injection provided partial relief, approximately 30%. Right knee injection today. Next line we are going to get her approved for Visco supplementation. I will see her back to start the series.

## 2014-03-23 NOTE — Progress Notes (Signed)
  Subjective:    CC: follow-up  HPI: Destiny Rocha is a very pleasant 59 year old female who comes in for follow-up of bilateral knee osteoarthritis. We injected her left knee at the last visit she returns significantly better, she continues to have right knee pain. Moderate, persistent, localized at the joint lines and under the kneecap. She did request an MRI the results of which will be dictated below.  Past medical history, Surgical history, Family history not pertinant except as noted below, Social history, Allergies, and medications have been entered into the medical record, reviewed, and no changes needed.   Review of Systems: No fevers, chills, night sweats, weight loss, chest pain, or shortness of breath.   Objective:    General: Well Developed, well nourished, and in no acute distress.  Neuro: Alert and oriented x3, extra-ocular muscles intact, sensation grossly intact.  HEENT: Normocephalic, atraumatic, pupils equal round reactive to light, neck supple, no masses, no lymphadenopathy, thyroid nonpalpable.  Skin: Warm and dry, no rashes. Cardiac: Regular rate and rhythm, no murmurs rubs or gallops, no lower extremity edema.  Respiratory: Clear to auscultation bilaterally. Not using accessory muscles, speaking in full sentences. Right Knee: Visible and palpable effusion with pain at the joint lines and the patellar facets. ROM normal in flexion and extension and lower leg rotation. Ligaments with solid consistent endpoints including ACL, PCL, LCL, MCL. Negative Mcmurray's and provocative meniscal tests. Non painful patellar compression. Patellar and quadriceps tendons unremarkable. Hamstring and quadriceps strength is normal.  Procedure: Real-time Ultrasound Guided aspiration/Injection of right knee  Device: GE Logiq E  Verbal informed consent obtained.  Time-out conducted.  Noted no overlying erythema, induration, or other signs of local infection.  Skin prepped in a sterile  fashion.  Local anesthesia: Topical Ethyl chloride.  With sterile technique and under real time ultrasound guidance:  Using an 18-gauge needle I aspirated 20 mL of straw-colored fluid, there was significant synovial hypertrophy, syringe switched and 2 mL kenalog 40, 4 mL lidocaine injected easily. Completed without difficulty  Pain immediately resolved suggesting accurate placement of the medication.  Advised to call if fevers/chills, erythema, induration, drainage, or persistent bleeding.  Images permanently stored and available for review in the ultrasound unit.  Impression: Technically successful ultrasound guided injection.  Impression and Recommendations:

## 2014-03-24 ENCOUNTER — Other Ambulatory Visit (HOSPITAL_COMMUNITY)
Admission: RE | Admit: 2014-03-24 | Discharge: 2014-03-24 | Disposition: A | Discharge status: Home / Self Care | Payer: 59 | Source: Ambulatory Visit | Attending: Nurse Practitioner | Admitting: Nurse Practitioner

## 2014-03-24 ENCOUNTER — Other Ambulatory Visit: Payer: Self-pay | Admitting: Nurse Practitioner

## 2014-03-24 DIAGNOSIS — Z01411 Encounter for gynecological examination (general) (routine) with abnormal findings: Secondary | ICD-10-CM | POA: Diagnosis not present

## 2014-03-24 DIAGNOSIS — Z1151 Encounter for screening for human papillomavirus (HPV): Secondary | ICD-10-CM | POA: Diagnosis present

## 2014-03-26 LAB — CYTOLOGY - PAP

## 2014-03-31 ENCOUNTER — Ambulatory Visit: Payer: Managed Care, Other (non HMO) | Admitting: Sports Medicine

## 2014-04-10 ENCOUNTER — Ambulatory Visit: Payer: Managed Care, Other (non HMO) | Admitting: Sports Medicine

## 2014-04-16 ENCOUNTER — Ambulatory Visit: Payer: Managed Care, Other (non HMO) | Admitting: Sports Medicine

## 2014-04-16 ENCOUNTER — Encounter: Payer: Self-pay | Admitting: Sports Medicine

## 2014-04-16 ENCOUNTER — Telehealth: Payer: Self-pay | Admitting: Sports Medicine

## 2014-04-16 ENCOUNTER — Ambulatory Visit (INDEPENDENT_AMBULATORY_CARE_PROVIDER_SITE_OTHER): Payer: 59 | Admitting: Sports Medicine

## 2014-04-16 VITALS — BP 96/59 | HR 118 | Wt 267.0 lb

## 2014-04-16 DIAGNOSIS — M170 Bilateral primary osteoarthritis of knee: Secondary | ICD-10-CM

## 2014-04-16 NOTE — Progress Notes (Signed)

## 2014-04-16 NOTE — Telephone Encounter (Signed)
Patient came in the office for an appointment but she is suppose to be getting injections by Dr. Darene Lamer and her insurance has changed and advised that she had not heard back from anyone if the injections were approved from new insurance company. Patient is on the schedule for 2:30 today and said she will call back to reschedule if she cant keep the 2:30. Pt req a call back @ [phone] to let her know if injections are approved or if she will be responsible. Thanks

## 2014-04-16 NOTE — Assessment & Plan Note (Signed)
Orthovisc injection of both knees. Return in one week for #2 of 4. We will not bill for the Orthovisc today, we will simply restock when her supply arrives.

## 2014-04-17 NOTE — Telephone Encounter (Signed)
Patient was seen in office and was given the injection.

## 2014-04-23 ENCOUNTER — Ambulatory Visit: Payer: Managed Care, Other (non HMO) | Admitting: Sports Medicine

## 2014-04-23 ENCOUNTER — Telehealth: Payer: Self-pay

## 2014-04-23 NOTE — Telephone Encounter (Signed)
Tried to call patient by phone and noticed that patient does not have a phone number on file. Patient is on the schedule for Ortho Visc injection but I do not see where it was approved through her insurance. Patient was told in the beginning that she would be contacted once she was approved . Rhonda Cunningham,CMA

## 2014-04-27 ENCOUNTER — Telehealth: Payer: Self-pay | Admitting: Sports Medicine

## 2014-04-27 NOTE — Telephone Encounter (Signed)
Spoke with [name] today regarding OrthoVisc.  Her co-pay to get the OrthoVisc is $1,620.68.  We are going to hold off until we see if [facility] paid for the injection on 03/23/14.  I will cancel the next 3 appointments coming up.  Also, Jonte feels like the Meloxicam is not working.  She wants to go back on Celebrex.  What is your recommendation?

## 2014-04-27 NOTE — Telephone Encounter (Signed)
Celebrex is fine, that she need me to call in?  I have also placed the charges for Orthovisc in the previous appointment.

## 2014-04-29 ENCOUNTER — Telehealth: Payer: Self-pay

## 2014-04-29 NOTE — Telephone Encounter (Signed)
Richardson Landry left a message on my vm and wanted a call back to verify if Ortho Visc was to be dispensed as written, I called the number back and verified that it was to dispensed as written and also verified the mailing address of the patient. Rhonda Cunningham,CMA

## 2014-04-30 ENCOUNTER — Ambulatory Visit: Payer: Managed Care, Other (non HMO) | Admitting: Sports Medicine

## 2014-05-01 ENCOUNTER — Encounter: Payer: Self-pay | Admitting: Sports Medicine

## 2014-05-01 ENCOUNTER — Ambulatory Visit (INDEPENDENT_AMBULATORY_CARE_PROVIDER_SITE_OTHER): Payer: 59 | Admitting: Sports Medicine

## 2014-05-01 VITALS — BP 138/80 | HR 82 | Ht 68.0 in | Wt 265.0 lb

## 2014-05-01 DIAGNOSIS — M170 Bilateral primary osteoarthritis of knee: Secondary | ICD-10-CM

## 2014-05-01 MED ORDER — CELECOXIB 200 MG PO CAPS: ORAL_CAPSULE | ORAL | Status: DC

## 2014-05-01 NOTE — Assessment & Plan Note (Addendum)
Persistent pain, we are unable to do Orthovisc, co-pay is too high. Aspiration and injection for comfort today. She did respond relatively well to custom orthotics in the past, she does want another pair so she will come back for this. Total knee arthroplasty is an option once she has lost some weight, she has had an arthroscopy in the recent past that did not provide much response. We will also switch her back to Celebrex.

## 2014-05-01 NOTE — Progress Notes (Signed)
  Subjective:    CC: knee pain  HPI: Destiny Rocha returns, she has bilateral knee osteoarthritis, he did a single Orthovisc injection but she was unable to afford the co-pay for subsequent injections from her mail order pharmacy. She's having a recurrence of swelling in the left knee, moderate, persistent without radiation.  Past medical history, Surgical history, Family history not pertinant except as noted below, Social history, Allergies, and medications have been entered into the medical record, reviewed, and no changes needed.   Review of Systems: No fevers, chills, night sweats, weight loss, chest pain, or shortness of breath.   Objective:    General: Well Developed, well nourished, and in no acute distress.  Neuro: Alert and oriented x3, extra-ocular muscles intact, sensation grossly intact.  HEENT: Normocephalic, atraumatic, pupils equal round reactive to light, neck supple, no masses, no lymphadenopathy, thyroid nonpalpable.  Skin: Warm and dry, no rashes. Cardiac: Regular rate and rhythm, no murmurs rubs or gallops, no lower extremity edema.  Respiratory: Clear to auscultation bilaterally. Not using accessory muscles, speaking in full sentences.  Procedure: Real-time Ultrasound Guided aspiration/Injection of left knee Device: GE Logiq E  Verbal informed consent obtained.  Time-out conducted.  Noted no overlying erythema, induration, or other signs of local infection.  Skin prepped in a sterile fashion.  Local anesthesia: Topical Ethyl chloride.  With sterile technique and under real time ultrasound guidance:  25 mL of straw-colored fluid aspirated, syringe switched and 1 mL Kenalog 40, 4 mL lidocaine injected easily. Completed without difficulty  Pain immediately resolved suggesting accurate placement of the medication.  Advised to call if fevers/chills, erythema, induration, drainage, or persistent bleeding.  Images permanently stored and available for review in the ultrasound  unit.  Impression: Technically successful ultrasound guided injection.  Impression and Recommendations:

## 2014-05-07 ENCOUNTER — Ambulatory Visit: Payer: Managed Care, Other (non HMO) | Admitting: Sports Medicine

## 2014-05-08 ENCOUNTER — Other Ambulatory Visit: Payer: Self-pay

## 2014-05-08 DIAGNOSIS — M170 Bilateral primary osteoarthritis of knee: Secondary | ICD-10-CM

## 2014-05-08 MED ORDER — CELECOXIB 200 MG PO CAPS: ORAL_CAPSULE | ORAL | Status: DC

## 2014-05-14 ENCOUNTER — Ambulatory Visit: Payer: Managed Care, Other (non HMO) | Admitting: Sports Medicine

## 2014-05-28 ENCOUNTER — Encounter: Payer: 59 | Admitting: Sports Medicine

## 2014-07-30 ENCOUNTER — Ambulatory Visit (INDEPENDENT_AMBULATORY_CARE_PROVIDER_SITE_OTHER): Payer: 59 | Admitting: Sports Medicine

## 2014-07-30 ENCOUNTER — Encounter: Payer: Self-pay | Admitting: Sports Medicine

## 2014-07-30 DIAGNOSIS — M170 Bilateral primary osteoarthritis of knee: Secondary | ICD-10-CM

## 2014-07-30 NOTE — Progress Notes (Signed)
  Subjective:    CC: follow-up  HPI: Destiny Rocha returns, she has bilateral knee osteoarthritis, we were unable to get Orthovisc approved, and it has been 3 months since her last steroid injection. Pain is moderate, persistent and localized under the kneecap. It is also under the medial joint line, no mechanical symptoms.  Past medical history, Surgical history, Family history not pertinant except as noted below, Social history, Allergies, and medications have been entered into the medical record, reviewed, and no changes needed.   Review of Systems: No fevers, chills, night sweats, weight loss, chest pain, or shortness of breath.   Objective:    General: Well Developed, well nourished, and in no acute distress.  Neuro: Alert and oriented x3, extra-ocular muscles intact, sensation grossly intact.  HEENT: Normocephalic, atraumatic, pupils equal round reactive to light, neck supple, no masses, no lymphadenopathy, thyroid nonpalpable.  Skin: Warm and dry, no rashes. Cardiac: Regular rate and rhythm, no murmurs rubs or gallops, no lower extremity edema.  Respiratory: Clear to auscultation bilaterally. Not using accessory muscles, speaking in full sentences.  Procedure: Real-time Ultrasound Guided Injection of left knee Device: GE Logiq E  Verbal informed consent obtained.  Time-out conducted.  Noted no overlying erythema, induration, or other signs of local infection.  Skin prepped in a sterile fashion.  Local anesthesia: Topical Ethyl chloride.  With sterile technique and under real time ultrasound guidance:  20 mL straw-colored fluid aspirated, syringe switched and 2 mL kenalog 40, 4 mL lidocaine injected easily. Completed without difficulty  Pain immediately resolved suggesting accurate placement of the medication.  Advised to call if fevers/chills, erythema, induration, drainage, or persistent bleeding.  Images permanently stored and available for review in the ultrasound unit.    Impression: Technically successful ultrasound guided injection.  Impression and Recommendations:

## 2014-07-30 NOTE — Assessment & Plan Note (Signed)
Aspiration and injection of the left knee.  she does need a knee replacement, referral to orthopedic surgery.

## 2014-08-19 HISTORY — PX: BREAST BIOPSY: SHX20

## 2014-08-27 ENCOUNTER — Ambulatory Visit: Payer: 59 | Admitting: Sports Medicine

## 2014-10-16 ENCOUNTER — Ambulatory Visit: Payer: Self-pay | Admitting: Orthopedic Surgery

## 2014-10-20 ENCOUNTER — Emergency Department (HOSPITAL_COMMUNITY)
Admission: EM | Admit: 2014-10-20 | Discharge: 2014-10-20 | Disposition: A | Discharge status: Home / Self Care | Payer: 59 | Attending: Physician Assistant | Admitting: Physician Assistant

## 2014-10-20 ENCOUNTER — Encounter (HOSPITAL_COMMUNITY): Payer: Self-pay

## 2014-10-20 DIAGNOSIS — R42 Dizziness and giddiness: Secondary | ICD-10-CM

## 2014-10-20 DIAGNOSIS — R110 Nausea: Secondary | ICD-10-CM | POA: Diagnosis not present

## 2014-10-20 DIAGNOSIS — M1990 Unspecified osteoarthritis, unspecified site: Secondary | ICD-10-CM | POA: Insufficient documentation

## 2014-10-20 DIAGNOSIS — Z79899 Other long term (current) drug therapy: Secondary | ICD-10-CM | POA: Diagnosis not present

## 2014-10-20 MED ORDER — MECLIZINE HCL 50 MG PO TABS: 25.0000 mg | ORAL_TABLET | Freq: Three times a day (TID) | ORAL | Status: DC | PRN

## 2014-10-20 MED ORDER — MECLIZINE HCL 25 MG PO TABS
12.5000 mg | ORAL_TABLET | Freq: Once | ORAL | Status: AC
  Administered 2014-10-20: 12.5 mg via ORAL
  Filled 2014-10-20: qty 1

## 2014-10-20 MED ORDER — ONDANSETRON 4 MG PO TBDP
4.0000 mg | ORAL_TABLET | Freq: Once | ORAL | Status: AC
  Administered 2014-10-20: 4 mg via ORAL
  Filled 2014-10-20: qty 1

## 2014-10-20 NOTE — ED Notes (Signed)
Pt states woke up this morning as normal.  Began to stand up.  Room began spinning and pt became nauseated and diaphoretic.  Pt states was fine up until yesterday.  No emesis. Eating and drinking ok.  No chest pain.  Does have some sinus pressure.  Denies ear pain.  No change in medications.

## 2014-10-20 NOTE — ED Provider Notes (Signed)
CSN: [id number]     Arrival date & time 10/20/14  0759 History   First MD Initiated Contact with Patient 10/20/14 [phone]     Chief Complaint  Patient presents with  . Dizziness  . Nausea     (Consider location/radiation/quality/duration/timing/severity/associated sxs/prior Treatment) Patient is a 59 y.o. female presenting with dizziness.  Dizziness Associated symptoms: no chest pain     Patient is a pleasant 59 year old female presenting today with dizziness after turning her head. Patient reports that she woke up this morning turned her head and became very dizzy. It resolves after closing her eyes are little bit time. Patient also reports. She has sinus infection for the last 2 days. No fevers. No tinnitus and no difficult in hearing. she had nausea associated with dizziness but no chest pain or short of breath. Patient has no history of hypertension hyperlipidemia and is a nonsmoker.  Past Medical History  Diagnosis Date  . Arthritis   . Osteoporosis    Past Surgical History  Procedure Laterality Date  . Knee surgery    . Cesarean section     History reviewed. No pertinent family history. History  Substance Use Topics  . Smoking status: Never Smoker   . Smokeless tobacco: Not on file  . Alcohol Use: Yes   OB History    No data available     Review of Systems  Constitutional: Negative for activity change and fatigue.  HENT: Positive for sinus pressure. Negative for congestion, drooling, ear discharge, ear pain and facial swelling.   Eyes: Negative for discharge.  Respiratory: Negative for cough and chest tightness.   Cardiovascular: Negative for chest pain.  Gastrointestinal: Negative for abdominal distention.  Genitourinary: Negative for dysuria and difficulty urinating.  Musculoskeletal: Negative for joint swelling.  Skin: Negative for rash.  Allergic/Immunologic: Negative for immunocompromised state.  Neurological: Positive for dizziness. Negative for seizures,  facial asymmetry and speech difficulty.  Psychiatric/Behavioral: Negative for behavioral problems and agitation.      Allergies  Review of patient's allergies indicates no known allergies.  Home Medications   Prior to Admission medications   Medication Sig Start Date End Date Taking? Authorizing Provider  b complex vitamins tablet Take by mouth.    Historical Provider, MD  Cholecalciferol (VITAMIN D PO) Take by mouth.    Historical Provider, MD  Glucosamine-Chondroitin 500-400 MG CAPS 1 tab by mouth TID 01/21/14   Silverio Decamp, MD  IRON PO Take by mouth.    Historical Provider, MD  meloxicam (MOBIC) 15 MG tablet Take 15 mg by mouth daily.    Historical Provider, MD  Multiple Vitamin (MULTIVITAMIN) tablet Take 1 tablet by mouth daily.    Historical Provider, MD  phentermine 37.5 MG capsule Take 37.5 mg by mouth every morning.    Historical Provider, MD  simvastatin (ZOCOR) 20 MG tablet Take 20 mg by mouth daily.    Historical Provider, MD   BP 142/90 mmHg  Pulse 68  Temp(Src) 98.3 F (36.8 C) (Oral)  Resp 18  Ht 5\' 8"  (1.727 m)  Wt 263 lb (119.296 kg)  BMI 40.00 kg/m2  SpO2 100% Physical Exam  Constitutional: She is oriented to person, place, and time. She appears well-developed and well-nourished.  HENT:  Head: Normocephalic and atraumatic.  Mild irritation of TM, mild sinus tenderness  Eyes: Conjunctivae are normal. Right eye exhibits no discharge.  Neck: Neck supple.  Cardiovascular: Normal rate, regular rhythm and normal heart sounds.   No murmur heard. Pulmonary/Chest: Effort  normal and breath sounds normal. She has no wheezes. She has no rales.  Abdominal: Soft. She exhibits no distension. There is no tenderness.  Musculoskeletal: Normal range of motion. She exhibits no edema.  Neurological: She is oriented to person, place, and time. No cranial nerve deficit.  Nystagmus when head turned to the right.   Skin: Skin is warm and dry. No rash noted. She is not  diaphoretic.  Psychiatric: She has a normal mood and affect. Her behavior is normal.  Nursing note and vitals reviewed.   ED Course  Procedures (including critical care time) Labs Review Labs Reviewed - No data to display  Imaging Review No results found.   EKG Interpretation None      MDM   Final diagnoses:  None  Patient is a friendly 59 year old female presenting with dizziness induced by turning her head. Physical exam when I turn her head to left head was no effect. When turning to the right she became nauseous and dizzy with nystagmus. Suspect benign positional vertigo as an ideology for her dizziness.  Less likely posterior stroke given that she is nonsmoker she denies hypertension, hyperlipidemia and that is positional in nature. However if he continues we'll have her follow-up with her PCP for further workup.  Would like to do an EKG, however patient is refusing this time she is worried that her insurance will pay for it.  Also requested to do imaging, but patient does not want imaging at this time.   Will give meclizine for symtpoms and have her return as needed.   Courteney Julio Alm, MD 10/21/14 [phone]

## 2014-10-20 NOTE — ED Notes (Signed)
Patient refused to have EKG done.

## 2014-10-20 NOTE — Discharge Instructions (Signed)
Please return if you have dizziness that is not associated with turning her head. Please return for any other concerning symptoms. Pleasee follow-up with her primary care provider this week.  Benign Positional Vertigo Vertigo means you feel like you or your surroundings are moving when they are not. Benign positional vertigo is the most common form of vertigo. Benign means that the cause of your condition is not serious. Benign positional vertigo is more common in older adults. CAUSES  Benign positional vertigo is the result of an upset in the labyrinth system. This is an area in the middle ear that helps control your balance. This may be caused by a viral infection, head injury, or repetitive motion. However, often no specific cause is found. SYMPTOMS  Symptoms of benign positional vertigo occur when you move your head or eyes in different directions. Some of the symptoms may include:  Loss of balance and falls.  Vomiting.  Blurred vision.  Dizziness.  Nausea.  Involuntary eye movements (nystagmus). DIAGNOSIS  Benign positional vertigo is usually diagnosed by physical exam. If the specific cause of your benign positional vertigo is unknown, your caregiver may perform imaging tests, such as magnetic resonance imaging (MRI) or computed tomography (CT). TREATMENT  Your caregiver may recommend movements or procedures to correct the benign positional vertigo. Medicines such as meclizine, benzodiazepines, and medicines for nausea may be used to treat your symptoms. In rare cases, if your symptoms are caused by certain conditions that affect the inner ear, you may need surgery. HOME CARE INSTRUCTIONS   Follow your caregiver's instructions.  Move slowly. Do not make sudden body or head movements.  Avoid driving.  Avoid operating heavy machinery.  Avoid performing any tasks that would be dangerous to you or others during a vertigo episode.  Drink enough fluids to keep your urine clear or  pale yellow. SEEK IMMEDIATE MEDICAL CARE IF:   You develop problems with walking, weakness, numbness, or using your arms, hands, or legs.  You have difficulty speaking.  You develop severe headaches.  Your nausea or vomiting continues or gets worse.  You develop visual changes.  Your family or friends notice any behavioral changes.  Your condition gets worse.  You have a fever.  You develop a stiff neck or sensitivity to light. MAKE SURE YOU:   Understand these instructions.  Will watch your condition.  Will get help right away if you are not doing well or get worse. Document Released: 12/12/2005 Document Revised: 05/29/2011 Document Reviewed: 11/24/2010 [facility] Patient Information 2015 [place], [place]. This information is not intended to replace advice given to you by your health care provider. Make sure you discuss any questions you have with your health care provider. Please return if you have dizziness that is not associated with turning her head. Please return for any other concerning symptoms. These follow-up with her primary care provider this week.

## 2014-10-20 NOTE — ED Notes (Signed)
Patient informed that an EKG had been ordered by the EDP. Patient stated, again I can not afford the EKG. I do not think my insurance company will approve of this. Patient asked if writer could call and verify that with the insurance company. Patient informed that we did not verify procedures in the ED. Patient also informed that if she called and decided that she wanted the EKG to please let writer know. Patient agreed. EDP notified.

## 2014-12-16 ENCOUNTER — Ambulatory Visit: Payer: Self-pay | Admitting: Orthopedic Surgery

## 2015-01-07 ENCOUNTER — Inpatient Hospital Stay (HOSPITAL_COMMUNITY): Admission: RE | Admit: 2015-01-07 | Payer: 59 | Source: Ambulatory Visit | Admitting: Orthopedic Surgery

## 2015-01-07 ENCOUNTER — Encounter (HOSPITAL_COMMUNITY): Admission: RE | Payer: Self-pay | Source: Ambulatory Visit

## 2015-01-07 SURGERY — ARTHROPLASTY, KNEE, TOTAL
Anesthesia: Spinal | Site: Knee | Laterality: Left

## 2015-01-22 ENCOUNTER — Ambulatory Visit: Payer: Self-pay | Admitting: Orthopedic Surgery

## 2015-01-28 ENCOUNTER — Telehealth: Payer: Self-pay | Admitting: Sports Medicine

## 2015-01-28 NOTE — Telephone Encounter (Signed)
Patient called and canceled her appointment adv that she is getting knee surgery on 03/04/15 and just wanted to let you know. Thanks

## 2015-01-29 ENCOUNTER — Ambulatory Visit: Payer: 59 | Admitting: Sports Medicine

## 2015-02-16 ENCOUNTER — Ambulatory Visit: Payer: Self-pay | Admitting: Orthopedic Surgery

## 2015-02-16 NOTE — H&P (Signed)
TOTAL KNEE ADMISSION H&P  Patient is being admitted for left total knee arthroplasty.  Subjective:  Chief Complaint:left knee pain.  HPI: Destiny Rocha, 59 y.o. female, has a history of pain and functional disability in the left knee due to arthritis and has failed non-surgical conservative treatments for greater than 12 weeks to includeNSAID's and/or analgesics, corticosteriod injections, flexibility and strengthening excercises, use of assistive devices, weight reduction as appropriate and activity modification.  Onset of symptoms was gradual, starting >10 years ago with gradually worsening course since that time. The patient noted prior procedures on the knee to include  arthroscopy and menisectomy on the left knee(s).  Patient currently rates pain in the left knee(s) at 10 out of 10 with activity. Patient has night pain, worsening of pain with activity and weight bearing, pain that interferes with activities of daily living, pain with passive range of motion, crepitus and joint swelling.  Patient has evidence of subchondral cysts, subchondral sclerosis, periarticular osteophytes, joint subluxation and joint space narrowing by imaging studies.  There is no active infection.  Patient Active Problem List   Diagnosis Date Noted  . Intraductal papilloma of left breast 10/06/2013  . Osteoarthritis of both knees 09/08/2013  . Morbid obesity ([place]) 09/08/2013   Past Medical History  Diagnosis Date  . Arthritis   . Osteoporosis     Past Surgical History  Procedure Laterality Date  . Knee surgery    . Cesarean section       (Not in a hospital admission) Allergies  Allergen Reactions  . Morphine And Related Nausea And Vomiting    Social History  Substance Use Topics  . Smoking status: Never Smoker   . Smokeless tobacco: Not on file  . Alcohol Use: Yes    No family history on file.   Review of Systems  Constitutional: Positive for malaise/fatigue.  HENT: Negative.   Eyes:  Positive for blurred vision. Negative for double vision, photophobia, pain, discharge and redness.  Respiratory: Negative.   Cardiovascular: Negative.   Gastrointestinal: Positive for constipation. Negative for heartburn, nausea, vomiting, abdominal pain, diarrhea, blood in stool and melena.  Genitourinary: Negative.   Musculoskeletal: Positive for myalgias and joint pain.  Skin: Positive for itching and rash.  Neurological: Negative.   Endo/Heme/Allergies: Negative.   Psychiatric/Behavioral: Negative.     Objective:  Physical Exam  Constitutional: She is oriented to person, place, and time. She appears well-developed and well-nourished.  HENT:  Head: Normocephalic and atraumatic.  Eyes: Conjunctivae and EOM are normal. Pupils are equal, round, and reactive to light.  Neck: Normal range of motion. Neck supple.  Cardiovascular: Normal rate, regular rhythm, normal heart sounds and intact distal pulses.   Respiratory: Breath sounds normal. No respiratory distress.  GI: Soft. Bowel sounds are normal. She exhibits no distension.  Genitourinary:  deferred  Musculoskeletal:       Left knee: She exhibits decreased range of motion, swelling, effusion and bony tenderness. Tenderness found. Medial joint line tenderness noted.  ROM 10-110  Neurological: She is alert and oriented to person, place, and time. She has normal reflexes.  Skin: Skin is warm and dry.  Psychiatric: She has a normal mood and affect. Her behavior is normal. Judgment and thought content normal.    Vital signs in last 24 hours: @VSRANGES @  Labs:   Estimated body mass index is 40.00 kg/(m^2) as calculated from the following:   Height as of 10/20/14: 5\' 8"  (1.727 m).   Weight as of 10/20/14: [id number]  kg (263 lb).   Imaging Review Plain radiographs demonstrate severe degenerative joint disease of the left knee(s). The overall alignment issignificant varus. The bone quality appears to be adequate for age and reported  activity level.  Assessment/Plan:  End stage arthritis, left knee   The patient history, physical examination, clinical judgment of the provider and imaging studies are consistent with end stage degenerative joint disease of the left knee(s) and total knee arthroplasty is deemed medically necessary. The treatment options including medical management, injection therapy arthroscopy and arthroplasty were discussed at length. The risks and benefits of total knee arthroplasty were presented and reviewed. The risks due to aseptic loosening, infection, stiffness, patella tracking problems, thromboembolic complications and other imponderables were discussed. The patient acknowledged the explanation, agreed to proceed with the plan and consent was signed. Patient is being admitted for inpatient treatment for surgery, pain control, PT, OT, prophylactic antibiotics, VTE prophylaxis, progressive ambulation and ADL's and discharge planning. The patient is planning to be discharged home with home health services

## 2015-02-23 ENCOUNTER — Other Ambulatory Visit (HOSPITAL_COMMUNITY): Payer: Self-pay | Admitting: *Deleted

## 2015-02-23 NOTE — Progress Notes (Signed)
Medical clearnce note mauney pac on chart ekg 12-10-14 on chart mauney pac

## 2015-02-23 NOTE — Patient Instructions (Addendum)
Destiny Rocha  02/23/2015   Your procedure is scheduled on: 03-04-15  Report to [facility]  Entrance take [facility]  elevators to 3rd floor to  [facility] at 145 pm.  Call this number if you have problems the morning of surgery [phone]   Remember: ONLY 1 PERSON MAY GO WITH YOU TO SHORT STAY TO GET  READY MORNING OF [place].  Do not eat food  :After Midnight, clear liquids midnight until 945 am , nothing by mouth after 945 am day of surgery.      Take these medicines the morning of surgery with A SIP OF WATER: tylenol if needed              You may not have any metal on your body including hair pins and              piercings  Do not wear jewelry, make-up, lotions, powders or perfumes, deodorant             Do not wear nail polish.  Do not shave  48 hours prior to surgery.              Men may shave face and neck.   Do not bring valuables to the hospital. [place].  Contacts, dentures or bridgework may not be worn into surgery.  Leave suitcase in the car. After surgery it may be brought to your room.     Patients discharged the day of surgery will not be allowed to drive home.  Name and phone number of your driver:  Special Instructions: N/A              Please read over the following fact sheets you were given: _____________________________________________________________________                CLEAR LIQUID DIET   Foods Allowed                                                                     Foods Excluded  Coffee and tea, regular and decaf                             liquids that you cannot  Plain Jell-O in any flavor                                             see through such as: Fruit ices (not with fruit pulp)                                     milk, soups, orange juice  Iced Popsicles  All solid food Carbonated beverages, regular  and diet                                    Cranberry, grape and apple juices Sports drinks like Gatorade Lightly seasoned clear broth or consume(fat free) Sugar, honey syrup  Sample Menu Breakfast                                Lunch                                     Supper Cranberry juice                    Beef broth                            Chicken broth Jell-O                                     Grape juice                           Apple juice Coffee or tea                        Jell-O                                      Popsicle                                                Coffee or tea                        Coffee or tea  _____________________________________________________________________  [facility] - Preparing for Surgery Before surgery, you can play an important role.  Because skin is not sterile, your skin needs to be as free of germs as possible.  You can reduce the number of germs on your skin by washing with CHG (chlorahexidine gluconate) soap before surgery.  CHG is an antiseptic cleaner which kills germs and bonds with the skin to continue killing germs even after washing. Please DO NOT use if you have an allergy to CHG or antibacterial soaps.  If your skin becomes reddened/irritated stop using the CHG and inform your nurse when you arrive at Short Stay. Do not shave (including legs and underarms) for at least 48 hours prior to the first CHG shower.  You may shave your face/neck. Please follow these instructions carefully:  1.  Shower with CHG Soap the night before surgery and the  morning of Surgery.  2.  If you choose to wash your hair, wash your hair first as usual with your  normal  shampoo.  3.  After you shampoo, rinse your hair and body thoroughly to remove the  shampoo.  4.  Use CHG as you would any other liquid soap.  You can apply chg directly  to the skin and wash                       Gently with a scrungie or clean  washcloth.  5.  Apply the CHG Soap to your body ONLY FROM THE NECK DOWN.   Do not use on face/ open                           Wound or open sores. Avoid contact with eyes, ears mouth and genitals (private parts).                       Wash face,  Genitals (private parts) with your normal soap.             6.  Wash thoroughly, paying special attention to the area where your surgery  will be performed.  7.  Thoroughly rinse your body with warm water from the neck down.  8.  DO NOT shower/wash with your normal soap after using and rinsing off  the CHG Soap.                9.  Pat yourself dry with a clean towel.            10.  Wear clean pajamas.            11.  Place clean sheets on your bed the night of your first shower and do not  sleep with pets. Day of Surgery : Do not apply any lotions/deodorants the morning of surgery.  Please wear clean clothes to the hospital/surgery center.  FAILURE TO FOLLOW THESE INSTRUCTIONS MAY RESULT IN THE CANCELLATION OF YOUR SURGERY PATIENT SIGNATURE_________________________________  NURSE SIGNATURE__________________________________  ________________________________________________________________________   Adam Phenix  An incentive spirometer is a tool that can help keep your lungs clear and active. This tool measures how well you are filling your lungs with each breath. Taking long deep breaths may help reverse or decrease the chance of developing breathing (pulmonary) problems (especially infection) following:  A long period of time when you are unable to move or be active. BEFORE THE PROCEDURE   If the spirometer includes an indicator to show your best effort, your nurse or respiratory therapist will set it to a desired goal.  If possible, sit up straight or lean slightly forward. Try not to slouch.  Hold the incentive spirometer in an upright position. INSTRUCTIONS FOR USE   Sit on the edge of your bed if possible, or sit up as far as  you can in bed or on a chair.  Hold the incentive spirometer in an upright position.  Breathe out normally.  Place the mouthpiece in your mouth and seal your lips tightly around it.  Breathe in slowly and as deeply as possible, raising the piston or the ball toward the top of the column.  Hold your breath for 3-5 seconds or for as long as possible. Allow the piston or ball to fall to the bottom of the column.  Remove the mouthpiece from your mouth and breathe out normally.  Rest for a few seconds and repeat Steps 1 through 7 at least 10 times every 1-2 hours when you are awake. Take your time and take a few normal breaths between deep breaths.  The spirometer may include an indicator to  show your best effort. Use the indicator as a goal to work toward during each repetition.  After each set of 10 deep breaths, practice coughing to be sure your lungs are clear. If you have an incision (the cut made at the time of surgery), support your incision when coughing by placing a pillow or rolled up towels firmly against it. Once you are able to get out of bed, walk around indoors and cough well. You may stop using the incentive spirometer when instructed by your caregiver.  RISKS AND COMPLICATIONS  Take your time so you do not get dizzy or light-headed.  If you are in pain, you may need to take or ask for pain medication before doing incentive spirometry. It is harder to take a deep breath if you are having pain. AFTER USE  Rest and breathe slowly and easily.  It can be helpful to keep track of a log of your progress. Your caregiver can provide you with a simple table to help with this. If you are using the spirometer at home, follow these instructions: [place] IF:   You are having difficultly using the spirometer.  You have trouble using the spirometer as often as instructed.  Your pain medication is not giving enough relief while using the spirometer.  You develop fever of  100.5 F (38.1 C) or higher. SEEK IMMEDIATE MEDICAL CARE IF:   You cough up bloody sputum that had not been present before.  You develop fever of 102 F (38.9 C) or greater.  You develop worsening pain at or near the incision site. MAKE SURE YOU:   Understand these instructions.  Will watch your condition.  Will get help right away if you are not doing well or get worse. Document Released: 07/17/2006 Document Revised: 05/29/2011 Document Reviewed: 09/17/2006 Exit[employer] Patient Information 2014 [employer], [place].   ________________________________________________________________________  WHAT IS A BLOOD TRANSFUSION? Blood Transfusion Information  A transfusion is the replacement of blood or some of its parts. Blood is made up of multiple cells which provide different functions.  Red blood cells carry oxygen and are used for blood loss replacement.  White blood cells fight against infection.  Platelets control bleeding.  Plasma helps clot blood.  Other blood products are available for specialized needs, such as hemophilia or other clotting disorders. BEFORE THE TRANSFUSION  Who gives blood for transfusions?   Healthy volunteers who are fully evaluated to make sure their blood is safe. This is blood bank blood. Transfusion therapy is the safest it has ever been in the practice of medicine. Before blood is taken from a donor, a complete history is taken to make sure that person has no history of diseases nor engages in risky social behavior (examples are intravenous drug use or sexual activity with multiple partners). The donor's travel history is screened to minimize risk of transmitting infections, such as malaria. The donated blood is tested for signs of infectious diseases, such as HIV and hepatitis. The blood is then tested to be sure it is compatible with you in order to minimize the chance of a transfusion reaction. If you or a relative donates blood, this is often done in  anticipation of surgery and is not appropriate for emergency situations. It takes many days to process the donated blood. RISKS AND COMPLICATIONS Although transfusion therapy is very safe and saves many lives, the main dangers of transfusion include:   Getting an infectious disease.  Developing a transfusion reaction. This is an allergic reaction  to something in the blood you were given. Every precaution is taken to prevent this. The decision to have a blood transfusion has been considered carefully by your caregiver before blood is given. Blood is not given unless the benefits outweigh the risks. AFTER THE TRANSFUSION  Right after receiving a blood transfusion, you will usually feel much better and more energetic. This is especially true if your red blood cells have gotten low (anemic). The transfusion raises the level of the red blood cells which carry oxygen, and this usually causes an energy increase.  The nurse administering the transfusion will monitor you carefully for complications. HOME CARE INSTRUCTIONS  No special instructions are needed after a transfusion. You may find your energy is better. Speak with your caregiver about any limitations on activity for underlying diseases you may have. SEEK MEDICAL CARE IF:   Your condition is not improving after your transfusion.  You develop redness or irritation at the intravenous (IV) site. SEEK IMMEDIATE MEDICAL CARE IF:  Any of the following symptoms occur over the next 12 hours:  Shaking chills.  You have a temperature by mouth above 102 F (38.9 C), not controlled by medicine.  Chest, back, or muscle pain.  People around you feel you are not acting correctly or are confused.  Shortness of breath or difficulty breathing.  Dizziness and fainting.  You get a rash or develop hives.  You have a decrease in urine output.  Your urine turns a dark color or changes to pink, red, or brown. Any of the following symptoms occur over  the next 10 days:  You have a temperature by mouth above 102 F (38.9 C), not controlled by medicine.  Shortness of breath.  Weakness after normal activity.  The white part of the eye turns yellow (jaundice).  You have a decrease in the amount of urine or are urinating less often.  Your urine turns a dark color or changes to pink, red, or brown. Document Released: 03/03/2000 Document Revised: 05/29/2011 Document Reviewed: 10/21/2007 [facility] Patient Information 2014 [place], [place].  _______________________________________________________________________

## 2015-02-24 ENCOUNTER — Encounter (HOSPITAL_COMMUNITY): Payer: Self-pay

## 2015-02-24 ENCOUNTER — Encounter (HOSPITAL_COMMUNITY)
Admission: RE | Admit: 2015-02-24 | Discharge: 2015-02-24 | Disposition: A | Discharge status: Home / Self Care | Payer: 59 | Source: Ambulatory Visit | Attending: Orthopedic Surgery | Admitting: Orthopedic Surgery

## 2015-02-24 DIAGNOSIS — M179 Osteoarthritis of knee, unspecified: Secondary | ICD-10-CM | POA: Insufficient documentation

## 2015-02-24 DIAGNOSIS — Z01818 Encounter for other preprocedural examination: Secondary | ICD-10-CM | POA: Diagnosis not present

## 2015-02-24 HISTORY — DX: Other complications of anesthesia, initial encounter: T88.59XA

## 2015-02-24 HISTORY — DX: Adverse effect of unspecified anesthetic, initial encounter: T41.45XA

## 2015-02-24 HISTORY — DX: Headache: R51

## 2015-02-24 HISTORY — DX: Pure hypercholesterolemia, unspecified: E78.00

## 2015-02-24 HISTORY — DX: Dermatitis, unspecified: L30.9

## 2015-02-24 HISTORY — DX: Pneumonia, unspecified organism: J18.9

## 2015-02-24 HISTORY — DX: Nausea with vomiting, unspecified: R11.2

## 2015-02-24 HISTORY — DX: Anxiety disorder, unspecified: F41.9

## 2015-02-24 HISTORY — DX: Dry eye syndrome of bilateral lacrimal glands: H04.123

## 2015-02-24 HISTORY — DX: Gastro-esophageal reflux disease without esophagitis: K21.9

## 2015-02-24 HISTORY — DX: Peripheral vascular disease, unspecified: I73.9

## 2015-02-24 HISTORY — DX: Headache, unspecified: R51.9

## 2015-02-24 HISTORY — DX: Other specified postprocedural states: Z98.890

## 2015-02-24 LAB — URINALYSIS, ROUTINE W REFLEX MICROSCOPIC
BILIRUBIN URINE: NEGATIVE
GLUCOSE, UA: NEGATIVE mg/dL
Hgb urine dipstick: NEGATIVE
KETONES UR: NEGATIVE mg/dL
Nitrite: NEGATIVE
PH: 6.5 (ref 5.0–8.0)
Protein, ur: NEGATIVE mg/dL
Specific Gravity, Urine: 1.025 (ref 1.005–1.030)

## 2015-02-24 LAB — CBC WITH DIFFERENTIAL/PLATELET
BASOS ABS: 0 10*3/uL (ref 0.0–0.1)
Basophils Relative: 0 %
EOS PCT: 3 %
Eosinophils Absolute: 0.2 10*3/uL (ref 0.0–0.7)
HCT: 39.6 % (ref 36.0–46.0)
Hemoglobin: 13.1 g/dL (ref 12.0–15.0)
LYMPHS ABS: 2.2 10*3/uL (ref 0.7–4.0)
LYMPHS PCT: 40 %
MCH: 28.5 pg (ref 26.0–34.0)
MCHC: 33.1 g/dL (ref 30.0–36.0)
MCV: 86.1 fL (ref 78.0–100.0)
MONO ABS: 0.4 10*3/uL (ref 0.1–1.0)
Monocytes Relative: 8 %
Neutro Abs: 2.8 10*3/uL (ref 1.7–7.7)
Neutrophils Relative %: 49 %
PLATELETS: 285 10*3/uL (ref 150–400)
RBC: 4.6 MIL/uL (ref 3.87–5.11)
RDW: 14.1 % (ref 11.5–15.5)
WBC: 5.6 10*3/uL (ref 4.0–10.5)

## 2015-02-24 LAB — URINE MICROSCOPIC-ADD ON

## 2015-02-24 LAB — COMPREHENSIVE METABOLIC PANEL
ALT: 19 U/L (ref 14–54)
ANION GAP: 5 (ref 5–15)
AST: 20 U/L (ref 15–41)
Albumin: 4.1 g/dL (ref 3.5–5.0)
Alkaline Phosphatase: 65 U/L (ref 38–126)
BUN: 17 mg/dL (ref 6–20)
CHLORIDE: 106 mmol/L (ref 101–111)
CO2: 28 mmol/L (ref 22–32)
Calcium: 9.1 mg/dL (ref 8.9–10.3)
Creatinine, Ser: 0.74 mg/dL (ref 0.44–1.00)
Glucose, Bld: 98 mg/dL (ref 65–99)
POTASSIUM: 4.1 mmol/L (ref 3.5–5.1)
Sodium: 139 mmol/L (ref 135–145)
Total Bilirubin: 0.6 mg/dL (ref 0.3–1.2)
Total Protein: 7.5 g/dL (ref 6.5–8.1)

## 2015-02-24 LAB — PROTIME-INR
INR: 1.08 (ref 0.00–1.49)
PROTHROMBIN TIME: 14.2 s (ref 11.6–15.2)

## 2015-02-24 LAB — APTT: APTT: 45 s — AB (ref 24–37)

## 2015-02-24 LAB — ABO/RH: ABO/RH(D): B POS

## 2015-02-24 LAB — SURGICAL PCR SCREEN
MRSA, PCR: POSITIVE — AB
Staphylococcus aureus: POSITIVE — AB

## 2015-02-24 NOTE — Progress Notes (Signed)
   02/24/15 0827  OBSTRUCTIVE SLEEP APNEA  Have you ever been diagnosed with sleep apnea through a sleep study? No  Do you snore loudly (loud enough to be heard through closed doors)?  1  Do you often feel tired, fatigued, or sleepy during the daytime (such as falling asleep during driving or talking to someone)? 1  Has anyone observed you stop breathing during your sleep? 1  Do you have, or are you being treated for high blood pressure? 0  BMI more than 35 kg/m2? 1  Age > 50 (1-yes) 1  Neck circumference greater than:Female 16 inches or larger, Female 17inches or larger? 1  Female Gender (Yes=1) 0  Obstructive Sleep Apnea Score 6  Score 5 or greater  Results sent to PCP

## 2015-02-24 NOTE — Progress Notes (Addendum)
Micro, ua results sent to dr swinteck by epicand left message with laurie faust

## 2015-02-26 ENCOUNTER — Ambulatory Visit: Payer: Self-pay | Admitting: Orthopedic Surgery

## 2015-02-26 MED ORDER — VANCOMYCIN HCL 10 G IV SOLR: 1000.0000 mg | INTRAVENOUS | Status: AC

## 2015-03-03 MED ORDER — DEXTROSE 5 % IV SOLN
3.0000 g | INTRAVENOUS | Status: AC
  Administered 2015-03-04: 3 g via INTRAVENOUS
  Filled 2015-03-03 (×2): qty 3000

## 2015-03-04 ENCOUNTER — Encounter (HOSPITAL_COMMUNITY)
Admission: RE | Disposition: A | Discharge status: Home / Self Care | Payer: Self-pay | Source: Ambulatory Visit | Attending: Orthopedic Surgery

## 2015-03-04 ENCOUNTER — Inpatient Hospital Stay (HOSPITAL_COMMUNITY): Payer: 59 | Admitting: Anesthesiology

## 2015-03-04 ENCOUNTER — Encounter (HOSPITAL_COMMUNITY): Payer: Self-pay | Admitting: *Deleted

## 2015-03-04 ENCOUNTER — Inpatient Hospital Stay (HOSPITAL_COMMUNITY): Payer: 59

## 2015-03-04 ENCOUNTER — Inpatient Hospital Stay (HOSPITAL_COMMUNITY)
Admission: RE | Admit: 2015-03-04 | Discharge: 2015-03-06 | DRG: 470 | Disposition: A | Discharge status: Home / Self Care | Payer: 59 | Source: Ambulatory Visit | Attending: Orthopedic Surgery | Admitting: Orthopedic Surgery

## 2015-03-04 DIAGNOSIS — Z885 Allergy status to narcotic agent status: Secondary | ICD-10-CM

## 2015-03-04 DIAGNOSIS — M1712 Unilateral primary osteoarthritis, left knee: Secondary | ICD-10-CM | POA: Diagnosis present

## 2015-03-04 DIAGNOSIS — I739 Peripheral vascular disease, unspecified: Secondary | ICD-10-CM | POA: Diagnosis present

## 2015-03-04 DIAGNOSIS — M79662 Pain in left lower leg: Secondary | ICD-10-CM | POA: Diagnosis not present

## 2015-03-04 DIAGNOSIS — M810 Age-related osteoporosis without current pathological fracture: Secondary | ICD-10-CM | POA: Diagnosis present

## 2015-03-04 DIAGNOSIS — Z6841 Body Mass Index (BMI) 40.0 and over, adult: Secondary | ICD-10-CM

## 2015-03-04 DIAGNOSIS — K219 Gastro-esophageal reflux disease without esophagitis: Secondary | ICD-10-CM | POA: Diagnosis present

## 2015-03-04 DIAGNOSIS — Z09 Encounter for follow-up examination after completed treatment for conditions other than malignant neoplasm: Secondary | ICD-10-CM

## 2015-03-04 HISTORY — PX: TOTAL KNEE ARTHROPLASTY: SHX125

## 2015-03-04 LAB — TYPE AND SCREEN
ABO/RH(D): B POS
ANTIBODY SCREEN: NEGATIVE

## 2015-03-04 SURGERY — ARTHROPLASTY, KNEE, TOTAL
Anesthesia: Spinal | Site: Knee | Laterality: Left

## 2015-03-04 MED ORDER — MIDAZOLAM HCL 5 MG/5ML IJ SOLN
INTRAMUSCULAR | Status: DC | PRN
  Administered 2015-03-04: 2 mg via INTRAVENOUS

## 2015-03-04 MED ORDER — OXYMETAZOLINE HCL 0.05 % NA SOLN
NASAL | Status: DC | PRN
  Administered 2015-03-04: 1 via NASAL

## 2015-03-04 MED ORDER — DEXAMETHASONE SODIUM PHOSPHATE 10 MG/ML IJ SOLN
INTRAMUSCULAR | Status: DC | PRN
  Administered 2015-03-04: 10 mg via INTRAVENOUS

## 2015-03-04 MED ORDER — ACETAMINOPHEN 650 MG RE SUPP: 650.0000 mg | Freq: Four times a day (QID) | RECTAL | Status: DC | PRN

## 2015-03-04 MED ORDER — SODIUM CHLORIDE 0.9 % IV SOLN: INTRAVENOUS | Status: DC

## 2015-03-04 MED ORDER — DIPHENHYDRAMINE HCL 12.5 MG/5ML PO ELIX: 12.5000 mg | ORAL_SOLUTION | ORAL | Status: DC | PRN

## 2015-03-04 MED ORDER — SODIUM CHLORIDE 0.9 % IV SOLN
10.0000 mg | INTRAVENOUS | Status: DC | PRN
  Administered 2015-03-04: 50 ug/min via INTRAVENOUS

## 2015-03-04 MED ORDER — HYDROGEN PEROXIDE 3 % EX SOLN
CUTANEOUS | Status: DC | PRN
  Administered 2015-03-04: 1

## 2015-03-04 MED ORDER — PHENYLEPHRINE HCL 10 MG/ML IJ SOLN
INTRAMUSCULAR | Status: DC | PRN
  Administered 2015-03-04 (×2): 80 ug via INTRAVENOUS

## 2015-03-04 MED ORDER — ONDANSETRON HCL 4 MG/2ML IJ SOLN
INTRAMUSCULAR | Status: AC
  Filled 2015-03-04: qty 2

## 2015-03-04 MED ORDER — EPHEDRINE SULFATE 50 MG/ML IJ SOLN
INTRAMUSCULAR | Status: AC
  Filled 2015-03-04: qty 1

## 2015-03-04 MED ORDER — METOCLOPRAMIDE HCL 5 MG/ML IJ SOLN: 5.0000 mg | Freq: Three times a day (TID) | INTRAMUSCULAR | Status: DC | PRN

## 2015-03-04 MED ORDER — 0.9 % SODIUM CHLORIDE (POUR BTL) OPTIME
TOPICAL | Status: DC | PRN
  Administered 2015-03-04: 1000 mL

## 2015-03-04 MED ORDER — ACETAMINOPHEN 10 MG/ML IV SOLN
INTRAVENOUS | Status: AC
  Filled 2015-03-04: qty 100

## 2015-03-04 MED ORDER — SODIUM CHLORIDE 0.9 % IV SOLN
INTRAVENOUS | Status: DC
  Administered 2015-03-04 – 2015-03-05 (×2): via INTRAVENOUS

## 2015-03-04 MED ORDER — DOCUSATE SODIUM 100 MG PO CAPS
100.0000 mg | ORAL_CAPSULE | Freq: Two times a day (BID) | ORAL | Status: DC
  Administered 2015-03-05 – 2015-03-06 (×3): 100 mg via ORAL

## 2015-03-04 MED ORDER — KETOROLAC TROMETHAMINE 30 MG/ML IJ SOLN
INTRAMUSCULAR | Status: DC | PRN
  Administered 2015-03-04: 30 mg

## 2015-03-04 MED ORDER — MELATONIN 3 MG PO CAPS: 3.0000 mg | ORAL_CAPSULE | Freq: Every day | ORAL | Status: DC

## 2015-03-04 MED ORDER — ALUM & MAG HYDROXIDE-SIMETH 200-200-20 MG/5ML PO SUSP: 30.0000 mL | ORAL | Status: DC | PRN

## 2015-03-04 MED ORDER — DEXAMETHASONE SODIUM PHOSPHATE 10 MG/ML IJ SOLN
10.0000 mg | Freq: Once | INTRAMUSCULAR | Status: AC
  Administered 2015-03-05: 10 mg via INTRAVENOUS
  Filled 2015-03-04: qty 1

## 2015-03-04 MED ORDER — FENTANYL CITRATE (PF) 100 MCG/2ML IJ SOLN
INTRAMUSCULAR | Status: AC
  Filled 2015-03-04: qty 2

## 2015-03-04 MED ORDER — KETOROLAC TROMETHAMINE 30 MG/ML IJ SOLN
INTRAMUSCULAR | Status: AC
  Filled 2015-03-04: qty 1

## 2015-03-04 MED ORDER — PHENOL 1.4 % MT LIQD: 1.0000 | OROMUCOSAL | Status: DC | PRN

## 2015-03-04 MED ORDER — ONDANSETRON HCL 4 MG/2ML IJ SOLN
INTRAMUSCULAR | Status: DC | PRN
  Administered 2015-03-04: 4 mg via INTRAVENOUS

## 2015-03-04 MED ORDER — KETOROLAC TROMETHAMINE 15 MG/ML IJ SOLN
15.0000 mg | Freq: Four times a day (QID) | INTRAMUSCULAR | Status: AC
  Administered 2015-03-05 (×4): 15 mg via INTRAVENOUS
  Filled 2015-03-04 (×5): qty 1

## 2015-03-04 MED ORDER — PHENYLEPHRINE HCL 10 MG/ML IJ SOLN
INTRAMUSCULAR | Status: AC
  Filled 2015-03-04: qty 1

## 2015-03-04 MED ORDER — MENTHOL 3 MG MT LOZG: 1.0000 | LOZENGE | OROMUCOSAL | Status: DC | PRN

## 2015-03-04 MED ORDER — PROPOFOL 10 MG/ML IV BOLUS
INTRAVENOUS | Status: AC
  Filled 2015-03-04: qty 20

## 2015-03-04 MED ORDER — SODIUM CHLORIDE 0.9 % IR SOLN
Status: DC | PRN
  Administered 2015-03-04 (×2): 1000 mL

## 2015-03-04 MED ORDER — BIOTIN 5000 MCG PO TABS: 5000.0000 ug | ORAL_TABLET | Freq: Every day | ORAL | Status: DC

## 2015-03-04 MED ORDER — HYDROCODONE-ACETAMINOPHEN 5-325 MG PO TABS
1.0000 | ORAL_TABLET | ORAL | Status: DC | PRN
Start: 2015-03-04 — End: 2015-03-06
  Administered 2015-03-05: 2 via ORAL
  Administered 2015-03-05 (×2): 1 via ORAL
  Administered 2015-03-05 – 2015-03-06 (×4): 2 via ORAL
  Filled 2015-03-04: qty 1
  Filled 2015-03-04 (×2): qty 2
  Filled 2015-03-04: qty 1
  Filled 2015-03-04 (×3): qty 2

## 2015-03-04 MED ORDER — PROPOFOL 500 MG/50ML IV EMUL
INTRAVENOUS | Status: DC | PRN
  Administered 2015-03-04: 140 ug/kg/min via INTRAVENOUS

## 2015-03-04 MED ORDER — BUPIVACAINE HCL (PF) 0.5 % IJ SOLN
INTRAMUSCULAR | Status: AC
  Filled 2015-03-04: qty 30

## 2015-03-04 MED ORDER — METHOCARBAMOL 1000 MG/10ML IJ SOLN
500.0000 mg | Freq: Four times a day (QID) | INTRAVENOUS | Status: DC | PRN
  Administered 2015-03-05: 500 mg via INTRAVENOUS
  Filled 2015-03-04 (×2): qty 5

## 2015-03-04 MED ORDER — PHENYLEPHRINE-GUAIFENESIN 5-200 MG PO TABS: 1.0000 | ORAL_TABLET | Freq: Two times a day (BID) | ORAL | Status: DC | PRN

## 2015-03-04 MED ORDER — BUPIVACAINE-EPINEPHRINE (PF) 0.25% -1:200000 IJ SOLN
INTRAMUSCULAR | Status: AC
  Filled 2015-03-04: qty 30

## 2015-03-04 MED ORDER — LACTATED RINGERS IV SOLN: INTRAVENOUS | Status: DC

## 2015-03-04 MED ORDER — ISOPROPYL ALCOHOL 70 % SOLN
Status: DC | PRN
  Administered 2015-03-04: 1 via TOPICAL

## 2015-03-04 MED ORDER — SODIUM CHLORIDE 0.9 % IJ SOLN
INTRAMUSCULAR | Status: AC
  Filled 2015-03-04: qty 50

## 2015-03-04 MED ORDER — SODIUM CHLORIDE 0.9 % IJ SOLN
INTRAMUSCULAR | Status: AC
  Filled 2015-03-04: qty 10

## 2015-03-04 MED ORDER — LIDOCAINE HCL (CARDIAC) 20 MG/ML IV SOLN
INTRAVENOUS | Status: AC
  Filled 2015-03-04: qty 5

## 2015-03-04 MED ORDER — GUAIFENESIN 200 MG PO TABS
200.0000 mg | ORAL_TABLET | Freq: Two times a day (BID) | ORAL | Status: DC | PRN
  Filled 2015-03-04: qty 1

## 2015-03-04 MED ORDER — PROPOFOL 10 MG/ML IV BOLUS
INTRAVENOUS | Status: AC
  Filled 2015-03-04: qty 60

## 2015-03-04 MED ORDER — ACETAMINOPHEN 325 MG PO TABS: 650.0000 mg | ORAL_TABLET | Freq: Four times a day (QID) | ORAL | Status: DC | PRN

## 2015-03-04 MED ORDER — BUPIVACAINE HCL (PF) 0.5 % IJ SOLN
INTRAMUSCULAR | Status: DC | PRN
  Administered 2015-03-04: 3 mL via INTRATHECAL

## 2015-03-04 MED ORDER — FENTANYL CITRATE (PF) 100 MCG/2ML IJ SOLN
INTRAMUSCULAR | Status: DC | PRN
  Administered 2015-03-04: 100 ug via INTRAVENOUS

## 2015-03-04 MED ORDER — VANCOMYCIN HCL 10 G IV SOLR
1500.0000 mg | Freq: Two times a day (BID) | INTRAVENOUS | Status: AC
  Administered 2015-03-05: 1500 mg via INTRAVENOUS
  Filled 2015-03-04: qty 1000

## 2015-03-04 MED ORDER — HYDROMORPHONE HCL 1 MG/ML IJ SOLN: 0.2500 mg | INTRAMUSCULAR | Status: DC | PRN

## 2015-03-04 MED ORDER — MIDAZOLAM HCL 2 MG/2ML IJ SOLN
INTRAMUSCULAR | Status: AC
  Filled 2015-03-04: qty 2

## 2015-03-04 MED ORDER — LACTATED RINGERS IV SOLN
INTRAVENOUS | Status: DC
  Administered 2015-03-04: 18:00:00 via INTRAVENOUS
  Administered 2015-03-04: 1000 mL via INTRAVENOUS

## 2015-03-04 MED ORDER — PROPOFOL 10 MG/ML IV BOLUS
INTRAVENOUS | Status: DC | PRN
  Administered 2015-03-04: 20 mg via INTRAVENOUS

## 2015-03-04 MED ORDER — ACETAMINOPHEN 10 MG/ML IV SOLN
INTRAVENOUS | Status: DC | PRN
  Administered 2015-03-04: 1000 mg via INTRAVENOUS

## 2015-03-04 MED ORDER — SODIUM CHLORIDE 0.9 % IJ SOLN
INTRAMUSCULAR | Status: DC | PRN
  Administered 2015-03-04: 29 mL

## 2015-03-04 MED ORDER — PREGABALIN 75 MG PO CAPS
75.0000 mg | ORAL_CAPSULE | Freq: Two times a day (BID) | ORAL | Status: DC
  Administered 2015-03-05 – 2015-03-06 (×2): 75 mg via ORAL
  Filled 2015-03-04 (×3): qty 1

## 2015-03-04 MED ORDER — PSEUDOEPHEDRINE HCL 30 MG PO TABS
30.0000 mg | ORAL_TABLET | Freq: Two times a day (BID) | ORAL | Status: DC | PRN
Start: 2015-03-04 — End: 2015-03-06
  Filled 2015-03-04: qty 1

## 2015-03-04 MED ORDER — OXYMETAZOLINE HCL 0.05 % NA SOLN
NASAL | Status: AC
  Filled 2015-03-04: qty 15

## 2015-03-04 MED ORDER — TRANEXAMIC ACID 1000 MG/10ML IV SOLN
1000.0000 mg | INTRAVENOUS | Status: AC
  Administered 2015-03-04: 1000 mg via INTRAVENOUS
  Filled 2015-03-04: qty 10

## 2015-03-04 MED ORDER — DEXAMETHASONE SODIUM PHOSPHATE 10 MG/ML IJ SOLN
INTRAMUSCULAR | Status: AC
  Filled 2015-03-04: qty 1

## 2015-03-04 MED ORDER — ONDANSETRON HCL 4 MG PO TABS: 4.0000 mg | ORAL_TABLET | Freq: Four times a day (QID) | ORAL | Status: DC | PRN

## 2015-03-04 MED ORDER — ONDANSETRON HCL 4 MG/2ML IJ SOLN
4.0000 mg | Freq: Four times a day (QID) | INTRAMUSCULAR | Status: DC | PRN
  Administered 2015-03-04: 4 mg via INTRAVENOUS
  Filled 2015-03-04: qty 2

## 2015-03-04 MED ORDER — BUPIVACAINE-EPINEPHRINE (PF) 0.25% -1:200000 IJ SOLN
INTRAMUSCULAR | Status: DC | PRN
  Administered 2015-03-04: 30 mL

## 2015-03-04 MED ORDER — VANCOMYCIN HCL 10 G IV SOLR
1500.0000 mg | Freq: Once | INTRAVENOUS | Status: AC
  Administered 2015-03-04: 1500 mg via INTRAVENOUS
  Filled 2015-03-04: qty 1500

## 2015-03-04 MED ORDER — ATORVASTATIN CALCIUM 20 MG PO TABS
20.0000 mg | ORAL_TABLET | Freq: Every day | ORAL | Status: DC
  Administered 2015-03-05: 20 mg via ORAL
  Filled 2015-03-04 (×2): qty 1

## 2015-03-04 MED ORDER — KETAMINE HCL 10 MG/ML IJ SOLN
INTRAMUSCULAR | Status: DC | PRN
  Administered 2015-03-04: 10 mg via INTRAVENOUS
  Administered 2015-03-04 (×2): 20 mg via INTRAVENOUS
  Administered 2015-03-04: 40 mg via INTRAVENOUS

## 2015-03-04 MED ORDER — APIXABAN 2.5 MG PO TABS
2.5000 mg | ORAL_TABLET | Freq: Two times a day (BID) | ORAL | Status: DC
  Administered 2015-03-05 – 2015-03-06 (×3): 2.5 mg via ORAL
  Filled 2015-03-04 (×5): qty 1

## 2015-03-04 MED ORDER — METOCLOPRAMIDE HCL 10 MG PO TABS: 5.0000 mg | ORAL_TABLET | Freq: Three times a day (TID) | ORAL | Status: DC | PRN

## 2015-03-04 MED ORDER — METHOCARBAMOL 500 MG PO TABS
500.0000 mg | ORAL_TABLET | Freq: Four times a day (QID) | ORAL | Status: DC | PRN
  Administered 2015-03-05 – 2015-03-06 (×3): 500 mg via ORAL
  Filled 2015-03-04 (×3): qty 1

## 2015-03-04 MED ORDER — HYDROMORPHONE HCL 1 MG/ML IJ SOLN
0.5000 mg | INTRAMUSCULAR | Status: DC | PRN
  Administered 2015-03-04 – 2015-03-05 (×2): 0.5 mg via INTRAVENOUS
  Filled 2015-03-04 (×2): qty 1

## 2015-03-04 MED ORDER — SENNA 8.6 MG PO TABS
2.0000 | ORAL_TABLET | Freq: Every day | ORAL | Status: DC
  Administered 2015-03-05: 17.2 mg via ORAL

## 2015-03-04 SURGICAL SUPPLY — 52 items
BAG SPEC THK2 15X12 ZIP CLS (MISCELLANEOUS) ×1
BAG ZIPLOCK 12X15 (MISCELLANEOUS) ×1 IMPLANT
BANDAGE ELASTIC 4 VELCRO ST LF (GAUZE/BANDAGES/DRESSINGS) ×2 IMPLANT
BANDAGE ELASTIC 6 VELCRO ST LF (GAUZE/BANDAGES/DRESSINGS) ×2 IMPLANT
BLADE SAW RECIPROCATING 77.5 (BLADE) ×2 IMPLANT
CAPT KNEE TRIATH TK-4 ×1 IMPLANT
CHLORAPREP W/TINT 26ML (MISCELLANEOUS) ×4 IMPLANT
CLOTH BEACON ORANGE TIMEOUT ST (SAFETY) ×2 IMPLANT
CUFF TOURN SGL QUICK 34 (TOURNIQUET CUFF)
CUFF TOURN SGL QUICK 44 (TOURNIQUET CUFF) ×1 IMPLANT
CUFF TRNQT CYL 34X4X40X1 (TOURNIQUET CUFF) ×1 IMPLANT
DECANTER SPIKE VIAL GLASS SM (MISCELLANEOUS) ×2 IMPLANT
DRAPE LG THREE QUARTER DISP (DRAPES) ×4 IMPLANT
DRAPE U-SHAPE 47X51 STRL (DRAPES) ×2 IMPLANT
DRSG AQUACEL AG ADV 3.5X10 (GAUZE/BANDAGES/DRESSINGS) ×2 IMPLANT
DRSG TEGADERM 4X4.75 (GAUZE/BANDAGES/DRESSINGS) ×1 IMPLANT
ELECT PENCIL ROCKER SW 15FT (MISCELLANEOUS) ×2 IMPLANT
ELECT REM PT RETURN 15FT ADLT (MISCELLANEOUS) ×2 IMPLANT
EVACUATOR 1/8 PVC DRAIN (DRAIN) IMPLANT
GAUZE SPONGE 4X4 12PLY STRL (GAUZE/BANDAGES/DRESSINGS) ×2 IMPLANT
GLOVE BIO SURGEON STRL SZ8.5 (GLOVE) ×4 IMPLANT
GLOVE BIOGEL PI IND STRL 8.5 (GLOVE) ×1 IMPLANT
GLOVE BIOGEL PI INDICATOR 8.5 (GLOVE) ×1
GOWN SPEC L3 XXLG W/TWL (GOWN DISPOSABLE) ×2 IMPLANT
HANDPIECE INTERPULSE COAX TIP (DISPOSABLE) ×2
HOOD PEEL AWAY FACE SHEILD DIS (HOOD) ×4 IMPLANT
LIQUID BAND (GAUZE/BANDAGES/DRESSINGS) ×3 IMPLANT
MANIFOLD NEPTUNE II (INSTRUMENTS) ×2 IMPLANT
NDL SPNL 18GX3.5 QUINCKE PK (NEEDLE) ×1 IMPLANT
NEEDLE SPNL 18GX3.5 QUINCKE PK (NEEDLE) ×2 IMPLANT
PACK TOTAL KNEE CUSTOM (KITS) ×2 IMPLANT
PADDING CAST COTTON 6X4 STRL (CAST SUPPLIES) ×2 IMPLANT
POSITIONER SURGICAL ARM (MISCELLANEOUS) ×2 IMPLANT
SAW OSC TIP CART 19.5X105X1.3 (SAW) ×2 IMPLANT
SEALER BIPOLAR AQUA 6.0 (INSTRUMENTS) ×2 IMPLANT
SET HNDPC FAN SPRY TIP SCT (DISPOSABLE) ×1 IMPLANT
SET PAD KNEE POSITIONER (MISCELLANEOUS) ×2 IMPLANT
SOL PREP POV-IOD 4OZ 10% (MISCELLANEOUS) ×2 IMPLANT
SPONGE DRAIN TRACH 4X4 STRL 2S (GAUZE/BANDAGES/DRESSINGS) IMPLANT
SUT MNCRL AB 3-0 PS2 18 (SUTURE) ×2 IMPLANT
SUT MON AB 2-0 CT1 36 (SUTURE) ×4 IMPLANT
SUT VIC AB 1 CT1 36 (SUTURE) ×2 IMPLANT
SUT VIC AB 2-0 CT1 27 (SUTURE) ×2
SUT VIC AB 2-0 CT1 TAPERPNT 27 (SUTURE) ×1 IMPLANT
SUT VLOC 180 0 24IN GS25 (SUTURE) ×2 IMPLANT
SYR 50ML LL SCALE MARK (SYRINGE) ×2 IMPLANT
TOWER CARTRIDGE SMART MIX (DISPOSABLE) IMPLANT
TRAY FOLEY W/METER SILVER 14FR (SET/KITS/TRAYS/PACK) ×2 IMPLANT
TRAY FOLEY W/METER SILVER 16FR (SET/KITS/TRAYS/PACK) ×2 IMPLANT
WATER STERILE IRR 1500ML POUR (IV SOLUTION) ×2 IMPLANT
WRAP KNEE MAXI GEL POST OP (GAUZE/BANDAGES/DRESSINGS) ×2 IMPLANT
YANKAUER SUCT BULB TIP 10FT TU (MISCELLANEOUS) ×2 IMPLANT

## 2015-03-04 NOTE — Op Note (Signed)
OPERATIVE REPORT  SURGEON: Rod Can, MD.   ASSISTANT: Nehemiah Massed, PA-C.  PREOPERATIVE DIAGNOSIS: Left knee arthritis.   POSTOPERATIVE DIAGNOSIS: Left knee arthritis.   PROCEDURE: Left total knee arthroplasty.   IMPLANTS: Stryker Triathlon CR femur, size 5. Stryker Triathlon Tritanium tibia, size 5. X3 polyethelyene insert, size 13 mm, CR. Tritanium 3 button asymmetric patella, size 29 mm.  ANESTHESIA:  Spinal  ESTIMATED BLOOD LOSS: 150 mL.  ANTIBIOTICS: 3g ancef. 1.5g vancomycin.  DRAINS: None.  COMPLICATIONS: None   CONDITION: PACU - hemodynamically stable.   BRIEF CLINICAL NOTE: Destiny Rocha is a 59 y.o. female with a long-standing history of Left knee arthritis. After failing conservative management, the patient was indicated for total knee arthroplasty. The risks, benefits, and alternatives to the procedure were explained, and the patient elected to proceed.  PROCEDURE IN DETAIL: Spinal anesthesia was obtained in the pre-op holding area. Once inside the operative room, a foley catheter was inserted. The patient was then positioned, a nonsterile tourniquet was placed, and the lower extremity was prepped and draped in the normal sterile surgical fashion. A time-out was called verifying side and site of surgery. The patient received IV antibiotics within 60 minutes of beginning the procedure. The tourniquet was inflated to 340 mmHg.  An anterior approach to the knee was performed utilizing a midvastus arthrotomy. A medial release was performed and the patellar fat pad was excised. A 5-degree distal femur valgus cut was made with an intramedullary guide. A freehand patellar resection was performed, and the patella was sized an prepared with 3 lug holes.  The proximal tibial resection was performed using an extramedullary guide, leaving a bone island for the PCL. The menisci were excised. A spacer block was placed, and the alignment and balance in  extension were confirmed.   The distal femur was sized using the 3-degree external rotation guide referencing the posterior femoral cortex. The appropriate 4-in-1 cutting block was pinned into place, and the rotation was checked using a spacer block at 90 degrees of flexion. The remaining femoral cuts were performed, taking care to protect the MCL.  The tibia was sized and the trial tray was pinned into place. The remaining trail components were inserted. The knee was stable to varus and valgus stress through a full range of motion. The patella tracked centrally, and the PCL was well balanced. The trial components were removed, and the proximal tibial surface was prepared. Final components were impacted into place. The knee was tested for a final time and found to be well balanced.  The wound was copiously irrigated with a dilute betadine solution followed by normal saline with pulse lavage. Marcaine solution was injected into the periarticular soft tissue. The wound was closed in layers using #1 Vicryl and V-Loc for the fascia, 2-0 Vicryl for the subcutaneous fat, 2-0 Monocryl for the deep dermal layer, 3-0 running Monocryl subcuticular Stitch, and Dermabond for the skin. Once the glue was fully dried, an Aquacell Ag and compressive dressing were applied. The tourniquet was let down, and the patient was transported to the recovery room in stable ondition. Sponge, needle, and instrument counts were correct at the end of the case x2. The patient tolerated the procedure well and there were no known complications.  Please note that a surgical assistant was a medical necessity for this procedure in order to perform it in a safe and expeditious manner. Surgical assistant was necessary to retract the ligaments and vital neurovascular structures to prevent injury to them  and also necessary for proper positioning of the limb to allow for anatomic placement of the prosthesis.

## 2015-03-04 NOTE — Interval H&P Note (Signed)
History and Physical Interval Note:  03/04/2015 4:07 PM  Destiny Rocha  has presented today for surgery, with the diagnosis of left knee osteoarthritis  The various methods of treatment have been discussed with the patient and family. After consideration of risks, benefits and other options for treatment, the patient has consented to  Procedure(s): LEFT TOTAL KNEE ARTHROPLASTY (Left) as a surgical intervention .  The patient's history has been reviewed, patient examined, no change in status, stable for surgery.  I have reviewed the patient's chart and labs.  Questions were answered to the patient's satisfaction.     Shakyla Nolley, Horald Pollen

## 2015-03-04 NOTE — H&P (View-Only) (Signed)
TOTAL KNEE ADMISSION H&P  Patient is being admitted for left total knee arthroplasty.  Subjective:  Chief Complaint:left knee pain.  HPI: Destiny Rocha, 59 y.o. female, has a history of pain and functional disability in the left knee due to arthritis and has failed non-surgical conservative treatments for greater than 12 weeks to includeNSAID's and/or analgesics, corticosteriod injections, flexibility and strengthening excercises, use of assistive devices, weight reduction as appropriate and activity modification.  Onset of symptoms was gradual, starting >10 years ago with gradually worsening course since that time. The patient noted prior procedures on the knee to include  arthroscopy and menisectomy on the left knee(s).  Patient currently rates pain in the left knee(s) at 10 out of 10 with activity. Patient has night pain, worsening of pain with activity and weight bearing, pain that interferes with activities of daily living, pain with passive range of motion, crepitus and joint swelling.  Patient has evidence of subchondral cysts, subchondral sclerosis, periarticular osteophytes, joint subluxation and joint space narrowing by imaging studies.  There is no active infection.  Patient Active Problem List   Diagnosis Date Noted  . Intraductal papilloma of left breast 10/06/2013  . Osteoarthritis of both knees 09/08/2013  . Morbid obesity ([place]) 09/08/2013   Past Medical History  Diagnosis Date  . Arthritis   . Osteoporosis     Past Surgical History  Procedure Laterality Date  . Knee surgery    . Cesarean section       (Not in a hospital admission) Allergies  Allergen Reactions  . Morphine And Related Nausea And Vomiting    Social History  Substance Use Topics  . Smoking status: Never Smoker   . Smokeless tobacco: Not on file  . Alcohol Use: Yes    No family history on file.   Review of Systems  Constitutional: Positive for malaise/fatigue.  HENT: Negative.   Eyes:  Positive for blurred vision. Negative for double vision, photophobia, pain, discharge and redness.  Respiratory: Negative.   Cardiovascular: Negative.   Gastrointestinal: Positive for constipation. Negative for heartburn, nausea, vomiting, abdominal pain, diarrhea, blood in stool and melena.  Genitourinary: Negative.   Musculoskeletal: Positive for myalgias and joint pain.  Skin: Positive for itching and rash.  Neurological: Negative.   Endo/Heme/Allergies: Negative.   Psychiatric/Behavioral: Negative.     Objective:  Physical Exam  Constitutional: She is oriented to person, place, and time. She appears well-developed and well-nourished.  HENT:  Head: Normocephalic and atraumatic.  Eyes: Conjunctivae and EOM are normal. Pupils are equal, round, and reactive to light.  Neck: Normal range of motion. Neck supple.  Cardiovascular: Normal rate, regular rhythm, normal heart sounds and intact distal pulses.   Respiratory: Breath sounds normal. No respiratory distress.  GI: Soft. Bowel sounds are normal. She exhibits no distension.  Genitourinary:  deferred  Musculoskeletal:       Left knee: She exhibits decreased range of motion, swelling, effusion and bony tenderness. Tenderness found. Medial joint line tenderness noted.  ROM 10-110  Neurological: She is alert and oriented to person, place, and time. She has normal reflexes.  Skin: Skin is warm and dry.  Psychiatric: She has a normal mood and affect. Her behavior is normal. Judgment and thought content normal.    Vital signs in last 24 hours: @VSRANGES @  Labs:   Estimated body mass index is 40.00 kg/(m^2) as calculated from the following:   Height as of 10/20/14: 5\' 8"  (1.727 m).   Weight as of 10/20/14: [id number]  kg (263 lb).   Imaging Review Plain radiographs demonstrate severe degenerative joint disease of the left knee(s). The overall alignment issignificant varus. The bone quality appears to be adequate for age and reported  activity level.  Assessment/Plan:  End stage arthritis, left knee   The patient history, physical examination, clinical judgment of the provider and imaging studies are consistent with end stage degenerative joint disease of the left knee(s) and total knee arthroplasty is deemed medically necessary. The treatment options including medical management, injection therapy arthroscopy and arthroplasty were discussed at length. The risks and benefits of total knee arthroplasty were presented and reviewed. The risks due to aseptic loosening, infection, stiffness, patella tracking problems, thromboembolic complications and other imponderables were discussed. The patient acknowledged the explanation, agreed to proceed with the plan and consent was signed. Patient is being admitted for inpatient treatment for surgery, pain control, PT, OT, prophylactic antibiotics, VTE prophylaxis, progressive ambulation and ADL's and discharge planning. The patient is planning to be discharged home with home health services

## 2015-03-04 NOTE — Anesthesia Procedure Notes (Signed)
Spinal Patient location during procedure: OR Start time: 03/04/2015 5:10 PM End time: 03/04/2015 5:17 PM Staffing Resident/CRNA: Felicha Frayne L Performed by: resident/CRNA  Preanesthetic Checklist Completed: patient identified, site marked, surgical consent, pre-op evaluation, timeout performed, IV checked, risks and benefits discussed and monitors and equipment checked Spinal Block Patient position: sitting Prep: Betadine Patient monitoring: heart rate, continuous pulse ox and blood pressure Approach: midline Location: L3-4 Injection technique: single-shot Needle Needle type: Sprotte  Needle gauge: 24 G Needle length: 10 cm Assessment Sensory level: T6 Additional Notes Kit expiration date 07/17/2016 and lot # [id number] Free flow CSF, negative heme, negative paresthesia Tolerated well and returned to supine position

## 2015-03-04 NOTE — Progress Notes (Signed)
PHARMACIST - PHYSICIAN ORDER COMMUNICATION  CONCERNING: P&T Medication Policy on Herbal Medications  DESCRIPTION:  This patient's orders for:  Biotin and Melatonin  have been noted.  These products are classified as an "herbal" or natural product. Due to a lack of definitive safety studies or FDA approval, nonstandard manufacturing practices, plus the potential risk of unknown drug-drug interactions while on inpatient medications, the Pharmacy and Therapeutics Committee does not permit the use of "herbal" or natural products of this type within [facility].   ACTION TAKEN: The pharmacy department is unable to verify this order at this time and your patient has been informed of this safety policy. Please reevaluate patient's clinical condition at discharge and address if the herbal or natural product(s) should be resumed at that time.  Thanks, Garnet Sierras, PharmD 03/04/2015

## 2015-03-04 NOTE — Anesthesia Preprocedure Evaluation (Signed)
Anesthesia Evaluation  Patient identified by MRN, date of birth, ID band Patient awake    Reviewed: Allergy & Precautions, H&P , NPO status , Patient's Chart, lab work & pertinent test results  History of Anesthesia Complications (+) PONV and PROLONGED EMERGENCE  Airway Mallampati: II  TM Distance: >3 FB Neck ROM: full    Dental no notable dental hx. (+) Dental Advisory Given, Teeth Intact   Pulmonary neg pulmonary ROS,    Pulmonary exam normal breath sounds clear to auscultation       Cardiovascular Exercise Tolerance: Good + Peripheral Vascular Disease  negative cardio ROS   Rhythm:regular Rate:Normal     Neuro/Psych negative neurological ROS  negative psych ROS   GI/Hepatic negative GI ROS, Neg liver ROS,   Endo/Other  Morbid obesity  Renal/GU negative Renal ROS  negative genitourinary   Musculoskeletal   Abdominal Normal abdominal exam  (+) + obese,   Peds  Hematology negative hematology ROS (+)   Anesthesia Other Findings   Reproductive/Obstetrics negative OB ROS                             Anesthesia Physical Anesthesia Plan  ASA: III  Anesthesia Plan: Spinal   Post-op Pain Management:    Induction:   Airway Management Planned:   Additional Equipment:   Intra-op Plan:   Post-operative Plan:   Informed Consent: I have reviewed the patients History and Physical, chart, labs and discussed the procedure including the risks, benefits and alternatives for the proposed anesthesia with the patient or authorized representative who has indicated his/her understanding and acceptance.   Dental Advisory Given  Plan Discussed with: CRNA and Surgeon  Anesthesia Plan Comments:         Anesthesia Quick Evaluation

## 2015-03-04 NOTE — Discharge Summary (Signed)
Physician Discharge Summary  Patient ID: Destiny Rocha MRN: [id number] DOB/AGE: 1955/12/27 59 y.o.  Admit date: 03/04/2015 Discharge date: 03/06/2015  Admission Diagnoses:  Primary osteoarthritis of left knee  Discharge Diagnoses:  Principal Problem:   Primary osteoarthritis of left knee   Past Medical History  Diagnosis Date  . Arthritis   . Elevated cholesterol   . Peripheral vascular disease ([place])   . Pneumonia as child  . Anxiety   . GERD (gastroesophageal reflux disease)     occasional  . Headache     sinus  . Complication of anesthesia     slow to awaken  . PONV (postoperative nausea and vomiting)   . Eczema   . Dry eyes     Surgeries: Procedure(s): LEFT TOTAL KNEE ARTHROPLASTY on 03/04/2015   Consultants (if any):    Discharged Condition: Improved  Hospital Course: Destiny Rocha is an 59 y.o. female who was admitted 03/04/2015 with a diagnosis of Primary osteoarthritis of left knee and went to the operating room on 03/04/2015 and underwent the above named procedures.    She was given perioperative antibiotics:      Anti-infectives    Start     Dose/Rate Route Frequency Ordered Stop   03/05/15 0500  vancomycin (VANCOCIN) 1,500 mg in sodium chloride 0.9 % 500 mL IVPB     1,500 mg 250 mL/hr over 120 Minutes Intravenous Every 12 hours 03/04/15 2110 03/05/15 0738   03/04/15 1600  vancomycin (VANCOCIN) 1,500 mg in sodium chloride 0.9 % 500 mL IVPB     1,500 mg 250 mL/hr over 120 Minutes Intravenous  Once 03/04/15 1544 03/04/15 1745   03/04/15 0600  ceFAZolin (ANCEF) 3 g in dextrose 5 % 50 mL IVPB     3 g 160 mL/hr over 30 Minutes Intravenous On call to O.R. 03/03/15 1347 03/04/15 1730    .  She was given sequential compression devices, early ambulation, and eliquis for DVT prophylaxis.  She benefited maximally from the hospital stay and there were no complications.    Recent vital signs:  Filed Vitals:   03/05/15 2043 03/06/15 0547  BP:  108/64 109/65  Pulse: 90 77  Temp: 98.6 F (37 C) 98.7 F (37.1 C)  Resp: 18 18    Recent laboratory studies:  Lab Results  Component Value Date   HGB 9.6* 03/06/2015   HGB 11.6* 03/05/2015   HGB 13.1 02/24/2015   Lab Results  Component Value Date   WBC 8.1 03/06/2015   PLT 203 03/06/2015   Lab Results  Component Value Date   INR 1.08 02/24/2015   Lab Results  Component Value Date   NA 140 03/05/2015   K 4.3 03/05/2015   CL 107 03/05/2015   CO2 26 03/05/2015   BUN 11 03/05/2015   CREATININE 0.78 03/05/2015   GLUCOSE 128* 03/05/2015    Discharge Medications:     Medication List    STOP taking these medications        acetaminophen 500 MG tablet  Commonly known as:  TYLENOL     ibuprofen 200 MG tablet  Commonly known as:  ADVIL,MOTRIN      TAKE these medications        apixaban 2.5 MG Tabs tablet  Commonly known as:  ELIQUIS  Take 1 tablet (2.5 mg total) by mouth 2 (two) times daily.     atorvastatin 20 MG tablet  Commonly known as:  LIPITOR  Take 20 mg by mouth daily at  6 PM.     Biotin 5000 MCG Tabs  Take 5,000 mcg by mouth daily.     cholecalciferol 1000 UNITS tablet  Commonly known as:  VITAMIN D  Take 1,000 Units by mouth every Monday, Wednesday, and Friday.     docusate sodium 100 MG capsule  Commonly known as:  COLACE  Take 1 capsule (100 mg total) by mouth 2 (two) times daily.     Glucosamine-Chondroitin 500-400 MG Caps  1 tab by mouth TID     HYDROcodone-acetaminophen 5-325 MG tablet  Commonly known as:  NORCO  Take 1-2 tablets by mouth every 4 (four) hours as needed for moderate pain.     L-Lysine 1000 MG Tabs  Take 1,000 mg by mouth daily.     Melatonin 3 MG Caps  Take 3 mg by mouth at bedtime.     methocarbamol 500 MG tablet  Commonly known as:  ROBAXIN  Take 1 tablet (500 mg total) by mouth every 6 (six) hours as needed for muscle spasms.     multivitamin tablet  Take 1 tablet by mouth every other day.     ondansetron  4 MG tablet  Commonly known as:  ZOFRAN  Take 1 tablet (4 mg total) by mouth every 6 (six) hours as needed for nausea.     Phenylephrine-Guaifenesin 5-200 MG Tabs  Take 1 tablet by mouth 2 (two) times daily as needed (cold symptoms).     pregabalin 75 MG capsule  Commonly known as:  LYRICA  Take 1 capsule (75 mg total) by mouth 2 (two) times daily.     senna 8.6 MG Tabs tablet  Commonly known as:  SENOKOT  Take 2 tablets (17.2 mg total) by mouth at bedtime.     UNABLE TO FIND  Electrolyte stamina magnesium, potassium, vit b 6. concen trace 2-4 tabs per day        Diagnostic Studies: Dg Knee 1-2 Views Left  2015-04-02  CLINICAL DATA:  Left knee arthroplasty.  Osteoarthritis. EXAM: LEFT KNEE - 1-2 VIEW COMPARISON:  03/18/2014 FINDINGS: Left total knee arthroplasty noted. Components are aligned. No acute osseous finding or hardware abnormality. Postop changes of the soft tissues. Obese body habitus. IMPRESSION: Left total knee arthroplasty changes.  No complicating feature. Electronically Signed   By: Jerilynn Mages.  Shick M.D.   On: 04-02-15 21:46    Disposition: 01-Home or Self Care  Discharge Instructions    Call MD / Call 911    Complete by:  As directed   If you experience chest pain or shortness of breath, CALL 911 and be transported to the hospital emergency room.  If you develope a fever above 101 F, pus (white drainage) or increased drainage or redness at the wound, or calf pain, call your surgeon's office.     Constipation Prevention    Complete by:  As directed   Drink plenty of fluids.  Prune juice may be helpful.  You may use a stool softener, such as Colace (over the counter) 100 mg twice a day.  Use MiraLax (over the counter) for constipation as needed.     Diet - low sodium heart healthy    Complete by:  As directed      Do not put a pillow under the knee. Place it under the heel.    Complete by:  As directed      Driving restrictions    Complete by:  As directed   No  driving for 6 weeks  Increase activity slowly as tolerated    Complete by:  As directed      Lifting restrictions    Complete by:  As directed   No lifting for 6 weeks     TED hose    Complete by:  As directed   Use stockings (TED hose) for 2 weeks on both leg(s).  You may remove them at night for sleeping.              Signed: Elie Goody 03/06/2015, 8:55 AM

## 2015-03-04 NOTE — Transfer of Care (Signed)
Immediate Anesthesia Transfer of Care Note  Patient: Destiny Rocha  Procedure(s) Performed: Procedure(s): LEFT TOTAL KNEE ARTHROPLASTY (Left)  Patient Location: PACU  Anesthesia Type:Spinal  Level of Consciousness:  sedated, patient cooperative and responds to stimulation  Airway & Oxygen Therapy:Patient Spontanous Breathing and Patient connected to face mask oxgen  Post-op Assessment:  Report given to PACU RN and Post -op Vital signs reviewed and stable  Post vital signs:  Reviewed and stable  Last Vitals:  Filed Vitals:   03/04/15 1429 03/04/15 2025  BP: 133/92   Pulse: 83 89  Temp: 36.4 C   Resp: 18 22    Complications: No apparent anesthesia complications

## 2015-03-05 ENCOUNTER — Encounter (HOSPITAL_COMMUNITY): Payer: Self-pay | Admitting: Orthopedic Surgery

## 2015-03-05 LAB — BASIC METABOLIC PANEL
ANION GAP: 7 (ref 5–15)
BUN: 11 mg/dL (ref 6–20)
CHLORIDE: 107 mmol/L (ref 101–111)
CO2: 26 mmol/L (ref 22–32)
Calcium: 8.8 mg/dL — ABNORMAL LOW (ref 8.9–10.3)
Creatinine, Ser: 0.78 mg/dL (ref 0.44–1.00)
GFR calc Af Amer: >60 mL/min (ref >60)
GFR calc non Af Amer: >60 mL/min (ref >60)
GLUCOSE: 128 mg/dL — AB (ref 65–99)
POTASSIUM: 4.3 mmol/L (ref 3.5–5.1)
Sodium: 140 mmol/L (ref 135–145)

## 2015-03-05 LAB — CBC
HEMATOCRIT: 34.3 % — AB (ref 36.0–46.0)
HEMOGLOBIN: 11.6 g/dL — AB (ref 12.0–15.0)
MCH: 28.7 pg (ref 26.0–34.0)
MCHC: 33.8 g/dL (ref 30.0–36.0)
MCV: 84.9 fL (ref 78.0–100.0)
Platelets: 221 10*3/uL (ref 150–400)
RBC: 4.04 MIL/uL (ref 3.87–5.11)
RDW: 13.8 % (ref 11.5–15.5)
WBC: 10.1 10*3/uL (ref 4.0–10.5)

## 2015-03-05 MED ORDER — PREGABALIN 75 MG PO CAPS: 75.0000 mg | ORAL_CAPSULE | Freq: Two times a day (BID) | ORAL | Status: DC

## 2015-03-05 MED ORDER — ONDANSETRON HCL 4 MG PO TABS: 4.0000 mg | ORAL_TABLET | Freq: Four times a day (QID) | ORAL | Status: DC | PRN

## 2015-03-05 MED ORDER — HYDROCODONE-ACETAMINOPHEN 5-325 MG PO TABS: 1.0000 | ORAL_TABLET | ORAL | Status: DC | PRN

## 2015-03-05 MED ORDER — APIXABAN 2.5 MG PO TABS: 2.5000 mg | ORAL_TABLET | Freq: Two times a day (BID) | ORAL | Status: DC

## 2015-03-05 MED ORDER — SENNA 8.6 MG PO TABS: 2.0000 | ORAL_TABLET | Freq: Every day | ORAL | Status: DC

## 2015-03-05 MED ORDER — DOCUSATE SODIUM 100 MG PO CAPS: 100.0000 mg | ORAL_CAPSULE | Freq: Two times a day (BID) | ORAL | Status: DC

## 2015-03-05 MED ORDER — METHOCARBAMOL 500 MG PO TABS: 500.0000 mg | ORAL_TABLET | Freq: Four times a day (QID) | ORAL | Status: DC | PRN

## 2015-03-05 NOTE — Progress Notes (Signed)
Utilization review completed.  

## 2015-03-05 NOTE — Evaluation (Signed)
Physical Therapy Evaluation Patient Details Name: Destiny Rocha MRN: [id number] DOB: [birth year] Today's Date: 03/05/2015   History of Present Illness  Pt is a 59 year old female s/p L TKA  Clinical Impression  Pt is s/p L TKA resulting in the deficits listed below (see PT Problem List).  Pt will benefit from skilled PT to increase their independence and safety with mobility to allow discharge to the venue listed below.   Pt mobilizing well POD #1 however c/o L calf cramping and knot (despite premedication with muscle relaxer) so RN notified.     Follow Up Recommendations Home health PT    Equipment Recommendations  Rolling walker with 5" wheels    Recommendations for Other Services       Precautions / Restrictions Precautions Precautions: Knee Restrictions Weight Bearing Restrictions: No Other Position/Activity Restrictions: WBAT      Mobility  Bed Mobility Overal bed mobility: Needs Assistance Bed Mobility: Supine to Sit     Supine to sit: Supervision;HOB elevated        Transfers Overall transfer level: Needs assistance Equipment used: Rolling walker (2 wheeled) Transfers: Sit to/from Stand Sit to Stand: Min guard         General transfer comment: verbal cues for UE and LE positioning  Ambulation/Gait Ambulation/Gait assistance: Min guard Ambulation Distance (Feet): 160 Feet Assistive device: Rolling walker (2 wheeled) Gait Pattern/deviations: Step-to pattern;Antalgic     General Gait Details: verbal cues for sequence, step length, RW positioning, posture  Stairs            Wheelchair Mobility    Modified Rankin (Stroke Patients Only)       Balance                                             Pertinent Vitals/Pain Pain Assessment: 0-10 Pain Score: 6  Pain Location: L calf Pain Descriptors / Indicators: Cramping;Tightness Pain Intervention(s): Limited activity within patient's tolerance;Monitored during  session (RN notified, pt already received muscle relaxer)    Home Living Family/patient expects to be discharged to:: Private residence Living Arrangements: Spouse/significant other   Type of Home: House Home Access: Stairs to enter   Technical brewer of Steps: 3 Home Layout: One level Home Equipment: None      Prior Function Level of Independence: Independent               Hand Dominance        Extremity/Trunk Assessment               Lower Extremity Assessment: LLE deficits/detail   LLE Deficits / Details: observed 80* L knee flexion EOB, good quad contraction     Communication   Communication: No difficulties  Cognition Arousal/Alertness: Awake/alert Behavior During Therapy: WFL for tasks assessed/performed Overall Cognitive Status: Within Functional Limits for tasks assessed                      General Comments      Exercises        Assessment/Plan    PT Assessment Patient needs continued PT services  PT Diagnosis Difficulty walking;Acute pain   PT Problem List Decreased strength;Decreased range of motion;Decreased mobility;Decreased knowledge of use of DME;Pain;Decreased knowledge of precautions  PT Treatment Interventions Functional mobility training;Stair training;Gait training;DME instruction;Therapeutic activities;Patient/family education;Therapeutic exercise   PT Goals (Current goals can  be found in the Care Plan section) Acute Rehab PT Goals PT Goal Formulation: With patient Time For Goal Achievement: 03/10/15 Potential to Achieve Goals: Good    Frequency 7X/week   Barriers to discharge        Co-evaluation               End of Session Equipment Utilized During Treatment: Gait belt Activity Tolerance: Patient tolerated treatment well Patient left: in chair;with call bell/phone within reach Nurse Communication: Mobility status         Time: 0940-1003 PT Time Calculation (min) (ACUTE ONLY): 23  min   Charges:   PT Evaluation $Initial PT Evaluation Tier I: 1 Procedure     PT G Codes:        Jamaurion Slemmer,KATHrine E 03/05/2015, 12:20 PM Carmelia Bake, PT, DPT 03/05/2015 Pager: [phone]

## 2015-03-05 NOTE — Evaluation (Signed)
Occupational Therapy Evaluation Patient Details Name: CAMMI KLUVER MRN: [id number] DOB: 1955-10-11 Today's Date: 03/05/2015    History of Present Illness Pt is a 59 year old female s/p L TKA   Clinical Impression   Patient evaluated by Occupational Therapy with no further acute OT needs identified. All education has been completed and the patient has no further questions. Pt currently requires supervision for ADLs.  All education completed.  Complaints of Lt calf pain. See below for any follow-up Occupational Therapy or equipment needs. OT is signing off. Thank you for this referral.      Follow Up Recommendations  No OT follow up;Supervision - Intermittent    Equipment Recommendations  3 in 1 bedside comode    Recommendations for Other Services       Precautions / Restrictions Precautions Precautions: Knee Restrictions Weight Bearing Restrictions: No Other Position/Activity Restrictions: WBAT      Mobility Bed Mobility Overal bed mobility: Needs Assistance Bed Mobility: Supine to Sit     Supine to sit: Supervision        Transfers Overall transfer level: Needs assistance Equipment used: Rolling walker (2 wheeled) Transfers: Sit to/from [employer] Transfers Sit to Stand: Supervision Stand pivot transfers: Supervision       General transfer comment: verbal cues for UE and LE positioning    Balance                                            ADL                                         General ADL Comments: Pt is able to perform ADLs with supervision.        Vision     Perception     Praxis      Pertinent Vitals/Pain Pain Assessment: 0-10 Pain Score: 7  Pain Location: Lt calf  Pain Descriptors / Indicators: Cramping Pain Intervention(s): Monitored during session;Repositioned     Hand Dominance     Extremity/Trunk Assessment Upper Extremity Assessment Upper Extremity Assessment: Overall  WFL for tasks assessed   Lower Extremity Assessment Lower Extremity Assessment: Defer to PT evaluation LLE Deficits / Details: observed 80* L knee flexion EOB, good quad contraction   Cervical / Trunk Assessment Cervical / Trunk Assessment: Normal   Communication Communication Communication: No difficulties   Cognition Arousal/Alertness: Awake/alert Behavior During Therapy: WFL for tasks assessed/performed Overall Cognitive Status: Within Functional Limits for tasks assessed                     General Comments       Exercises       Shoulder Instructions      Home Living Family/patient expects to be discharged to:: Private residence Living Arrangements: Spouse/significant other   Type of Home: House Home Access: Stairs to enter Technical brewer of Steps: 3   Home Layout: One level     Bathroom Shower/Tub: Occupational psychologist: Handicapped height Bathroom Accessibility: Yes How Accessible: Accessible via walker Home Equipment: Grab bars - toilet;Grab bars - tub/shower          Prior Functioning/Environment Level of Independence: Independent             OT Diagnosis: Acute  pain   OT Problem List: Pain   OT Treatment/Interventions:      OT Goals(Current goals can be found in the care plan section) Acute Rehab OT Goals Patient Stated Goal: to go home  OT Goal Formulation: All assessment and education complete, DC therapy  OT Frequency:     Barriers to D/C:            Co-evaluation              End of Session Equipment Utilized During Treatment: Rolling walker Nurse Communication: Mobility status  Activity Tolerance: Patient tolerated treatment well Patient left: in chair;with call bell/phone within reach;with chair alarm set   Time: [id number] OT Time Calculation (min): 29 min Charges:  OT General Charges $OT Visit: 1 Procedure OT Evaluation $Initial OT Evaluation Tier I: 1 Procedure OT Treatments $Self  Care/Home Management : 8-22 mins G-Codes:    Zebediah Beezley M 03/20/2015, 2:48 PM

## 2015-03-05 NOTE — Progress Notes (Signed)
Patient is complaining of left calf cramping/spasm. SCDs in place and patient has had Eliquis this AM. No other complaints or symptoms at this time. Dr. Lyla Glassing made aware. Will continue to monitor.

## 2015-03-05 NOTE — Progress Notes (Signed)
   Subjective:  Patient reports pain as mild to moderate.  No c/o. Denies N/V/CP/SOB.  Objective:   VITALS:   Filed Vitals:   03/04/15 2200 03/04/15 2300 03/05/15 0003 03/05/15 0400  BP: 127/75 123/71 118/72 116/66  Pulse: 68 64 67 69  Temp: 97.4 F (36.3 C) 98 F (36.7 C) 97.8 F (36.6 C) 97.9 F (36.6 C)  TempSrc: Oral Oral Oral Oral  Resp: 16 15 17 16   Height:      Weight:      SpO2: 96% 100% 100% 100%    ABD soft Sensation intact distally Intact pulses distally Dorsiflexion/Plantar flexion intact Incision: dressing C/D/I Compartment soft   Lab Results  Component Value Date   WBC 10.1 03/05/2015   HGB 11.6* 03/05/2015   HCT 34.3* 03/05/2015   MCV 84.9 03/05/2015   PLT 221 03/05/2015   BMET    Component Value Date/Time   NA 140 03/05/2015 0445   K 4.3 03/05/2015 0445   CL 107 03/05/2015 0445   CO2 26 03/05/2015 0445   GLUCOSE 128* 03/05/2015 0445   BUN 11 03/05/2015 0445   CREATININE 0.78 03/05/2015 0445   CALCIUM 8.8* 03/05/2015 0445   GFRNONAA >60 03/05/2015 0445   GFRAA >60 03/05/2015 0445     Assessment/Plan: 1 Day Post-Op   Principal Problem:   Primary osteoarthritis of left knee    WBAT with walker DVT ppx: apixaban, SCDs, TEDs PO pain control PT/OT Dispo: d/c home after [facility] PT, likely tomorrow   Destiny Rocha, Horald Pollen 03/05/2015, 6:45 AM   Rod Can, MD Cell [phone]

## 2015-03-05 NOTE — Progress Notes (Signed)
Physical Therapy Treatment Patient Details Name: Destiny Rocha MRN: [id number] DOB: Sep 23, 1955 Today's Date: 03/05/2015    History of Present Illness Pt is a 59 year old female s/p L TKA    PT Comments    POD # 1 pm session.  Assisted with amb an increased distance.  Practiced stairs which pt did well.  Max c/o posterior lateral L knee pain "cramping" which is relieved with activity/stretching/walker.  Noted increased edema/calf girth.  No "hot spots".   Pt performing at a safe mobility level to D/C to home today.  Follow Up Recommendations  Home health PT     Equipment Recommendations  Rolling walker with 5" wheels    Recommendations for Other Services       Precautions / Restrictions Precautions Precautions: Knee Restrictions Weight Bearing Restrictions: No Other Position/Activity Restrictions: WBAT    Mobility  Bed Mobility Overal bed mobility: Needs Assistance Bed Mobility: Supine to Sit     Supine to sit: Supervision     General bed mobility comments: Pt OOB in recliner  Transfers Overall transfer level: Needs assistance Equipment used: Rolling walker (2 wheeled) Transfers: Sit to/from Stand Sit to Stand: Supervision Stand pivot transfers: Supervision       General transfer comment: increased time  Ambulation/Gait Ambulation/Gait assistance: Supervision Ambulation Distance (Feet): 225 Feet Assistive device: Rolling walker (2 wheeled) Gait Pattern/deviations: Step-through pattern Gait velocity: WFL   General Gait Details: tolerated increased distance and increased alternating gait   Stairs Stairs: Yes Stairs assistance: Min guard Stair Management: One rail Left;Step to pattern;Sideways Number of Stairs: 4 General stair comments: performed 3 times.    Wheelchair Mobility    Modified Rankin (Stroke Patients Only)       Balance                                    Cognition Arousal/Alertness: Awake/alert Behavior  During Therapy: WFL for tasks assessed/performed Overall Cognitive Status: Within Functional Limits for tasks assessed                      Exercises      General Comments General comments (skin integrity, edema, etc.): Pt demonstrates good safety awareness.        Pertinent Vitals/Pain Pain Assessment: 0-10 Pain Score: 8  Pain Location: posterior L knee noted increased edema(no hot spots) Pain Descriptors / Indicators: Cramping;Sore;Tender Pain Intervention(s): Monitored during session;Repositioned    Home Living Family/patient expects to be discharged to:: Private residence Living Arrangements: Spouse/significant other   Type of Home: House Home Access: Stairs to enter   Home Layout: One level Home Equipment: Grab bars - toilet;Grab bars - tub/shower      Prior Function Level of Independence: Independent          PT Goals (current goals can now be found in the care plan section) Acute Rehab PT Goals Patient Stated Goal: to go home  Progress towards PT goals: Progressing toward goals    Frequency  7X/week    PT Plan Current plan remains appropriate    Co-evaluation             End of Session Equipment Utilized During Treatment: Gait belt Activity Tolerance: Patient tolerated treatment well Patient left: in chair;with call bell/phone within reach     Time: 1505-1540 PT Time Calculation (min) (ACUTE ONLY): 35 min  Charges:  $Gait Training: 8-22 mins $Therapeutic  Activity: 8-22 mins                    G Codes:      Rica Koyanagi  PTA [facility]  Acute  Rehab Pager      [phone]

## 2015-03-06 ENCOUNTER — Inpatient Hospital Stay (HOSPITAL_COMMUNITY): Payer: 59

## 2015-03-06 DIAGNOSIS — M79662 Pain in left lower leg: Secondary | ICD-10-CM

## 2015-03-06 LAB — CBC
HCT: 28.4 % — ABNORMAL LOW (ref 36.0–46.0)
HEMOGLOBIN: 9.6 g/dL — AB (ref 12.0–15.0)
MCH: 29.1 pg (ref 26.0–34.0)
MCHC: 33.8 g/dL (ref 30.0–36.0)
MCV: 86.1 fL (ref 78.0–100.0)
Platelets: 203 10*3/uL (ref 150–400)
RBC: 3.3 MIL/uL — ABNORMAL LOW (ref 3.87–5.11)
RDW: 14.1 % (ref 11.5–15.5)
WBC: 8.1 10*3/uL (ref 4.0–10.5)

## 2015-03-06 MED ORDER — METOCLOPRAMIDE HCL 5 MG PO TABS: 5.0000 mg | ORAL_TABLET | Freq: Three times a day (TID) | ORAL | Status: DC | PRN

## 2015-03-06 NOTE — Progress Notes (Signed)
Subjective: 2 Days Post-Op Procedure(s) (LRB): LEFT TOTAL KNEE ARTHROPLASTY (Left) Patient reports pain as 4 on 0-10 scale. Severe pain in Calf and a positive Homman's sign. Will hold DC and do a Stat Doppler.   Objective: Vital signs in last 24 hours: Temp:  [97.9 F (36.6 C)-98.7 F (37.1 C)] 98.7 F (37.1 C) (12/17 0547) Pulse Rate:  [77-90] 77 (12/17 0547) Resp:  [18-20] 18 (12/17 0547) BP: (106-120)/(64-68) 109/65 mmHg (12/17 0547) SpO2:  [98 %-100 %] 98 % (12/17 0547)  Intake/Output from previous day: 12/16 0701 - 12/17 0700 In: 1800 [P.O.:1800] Out: 2700 [Urine:2700] Intake/Output this shift:     Recent Labs  03/05/15 0445 03/06/15 0437  HGB 11.6* 9.6*    Recent Labs  03/05/15 0445 03/06/15 0437  WBC 10.1 8.1  RBC 4.04 3.30*  HCT 34.3* 28.4*  PLT 221 203    Recent Labs  03/05/15 0445  NA 140  K 4.3  CL 107  CO2 26  BUN 11  CREATININE 0.78  GLUCOSE 128*  CALCIUM 8.8*   No results for input(s): LABPT, INR in the last 72 hours.  Sever calf pain in Left Calf.  Assessment/Plan: 2 Days Post-Op Procedure(s) (LRB): LEFT TOTAL KNEE ARTHROPLASTY (Left) Plan for discharge tomorrow after Doppler.  Kameryn Davern A 03/06/2015, 9:05 AM

## 2015-03-06 NOTE — Progress Notes (Signed)
Subjective: 2 Days Post-Op Procedure(s) (LRB): LEFT TOTAL KNEE ARTHROPLASTY (Left) Patient reports pain as 5 on 0-10 scale.  Calf Pain Left.  Objective: Vital signs in last 24 hours: Temp:  [97.9 F (36.6 C)-98.7 F (37.1 C)] 98.7 F (37.1 C) (12/17 0547) Pulse Rate:  [77-90] 77 (12/17 0547) Resp:  [18-20] 18 (12/17 0547) BP: (106-120)/(64-68) 109/65 mmHg (12/17 0547) SpO2:  [98 %-100 %] 98 % (12/17 0547)  Intake/Output from previous day: 12/16 0701 - 12/17 0700 In: 1800 [P.O.:1800] Out: 2700 [Urine:2700] Intake/Output this shift:     Recent Labs  03/05/15 0445 03/06/15 0437  HGB 11.6* 9.6*    Recent Labs  03/05/15 0445 03/06/15 0437  WBC 10.1 8.1  RBC 4.04 3.30*  HCT 34.3* 28.4*  PLT 221 203    Recent Labs  03/05/15 0445  NA 140  K 4.3  CL 107  CO2 26  BUN 11  CREATININE 0.78  GLUCOSE 128*  CALCIUM 8.8*   No results for input(s): LABPT, INR in the last 72 hours.  Neurologically intact  Assessment/Plan: 2 Days Post-Op Procedure(s) (LRB): LEFT TOTAL KNEE ARTHROPLASTY (Left) Plan for discharge tomorrow If these abnormal clinical findings persist, appropriate workup will be completed. The patient understands that follow up is required to elucidate the situation. Doppler is Negative.  Fahd Galea A 03/06/2015, 9:25 AM

## 2015-03-06 NOTE — Progress Notes (Signed)
Discharged from floor via w/c, belongings & spouse with pt. No changes in assessment. Sakina Briones  

## 2015-03-06 NOTE — Discharge Instructions (Signed)
Information on my medicine - ELIQUIS (apixaban)  This medication education was reviewed with me or my healthcare representative as part of my discharge preparation.  The pharmacist that spoke with me during my hospital stay was:  Lolita Patella, [facility]  Why was Eliquis prescribed for you? Eliquis was prescribed for you to reduce the risk of blood clots forming after orthopedic surgery.    What do You need to know about Eliquis? Take your Eliquis TWICE DAILY - one tablet in the morning and one tablet in the evening with or without food.  It would be best to take the dose about the same time each day.  If you have difficulty swallowing the tablet whole please discuss with your pharmacist how to take the medication safely.  Take Eliquis exactly as prescribed by your doctor and DO NOT stop taking Eliquis without talking to the doctor who prescribed the medication.  Stopping without other medication to take the place of Eliquis may increase your risk of developing a clot.  After discharge, you should have regular check-up appointments with your healthcare provider that is prescribing your Eliquis.  What do you do if you miss a dose? If a dose of ELIQUIS is not taken at the scheduled time, take it as soon as possible on the same day and twice-daily administration should be resumed.  The dose should not be doubled to make up for a missed dose.  Do not take more than one tablet of ELIQUIS at the same time.  Important Safety Information A possible side effect of Eliquis is bleeding. You should call your healthcare provider right away if you experience any of the following: ? Bleeding from an injury or your nose that does not stop. ? Unusual colored urine (red or dark brown) or unusual colored stools (red or black). ? Unusual bruising for unknown reasons. ? A serious fall or if you hit your head (even if there is no bleeding).  Some medicines may interact with Eliquis and might  increase your risk of bleeding or clotting while on Eliquis. To help avoid this, consult your healthcare provider or pharmacist prior to using any new prescription or non-prescription medications, including herbals, vitamins, non-steroidal anti-inflammatory drugs (NSAIDs) and supplements.  This website has more information on Eliquis (apixaban): http://www.eliquis.com/eliquis/home  INSTRUCTIONS AFTER JOINT REPLACEMENT   o Remove items at home which could result in a fall. This includes throw rugs or furniture in walking pathways o ICE to the affected joint every three hours while awake for 30 minutes at a time, for at least the first 3-5 days, and then as needed for pain and swelling.  Continue to use ice for pain and swelling. You may notice swelling that will progress down to the foot and ankle.  This is normal after surgery.  Elevate your leg when you are not up walking on it.   o Continue to use the breathing machine you got in the hospital (incentive spirometer) which will help keep your temperature down.  It is common for your temperature to cycle up and down following surgery, especially at night when you are not up moving around and exerting yourself.  The breathing machine keeps your lungs expanded and your temperature down.   DIET:  As you were doing prior to hospitalization, we recommend a well-balanced diet.  DRESSING / WOUND CARE / SHOWERING  Keep the surgical dressing until follow up.  The dressing is water proof, so you can shower without any extra covering.  IF THE DRESSING FALLS OFF or the wound gets wet inside, change the dressing with sterile gauze.  Please use good hand washing techniques before changing the dressing.  Do not use any lotions or creams on the incision until instructed by your surgeon.    ACTIVITY  o Increase activity slowly as tolerated, but follow the weight bearing instructions below.   o No driving for 6 weeks or until further direction given by your  physician.  You cannot drive while taking narcotics.  o No lifting or carrying greater than 10 lbs. until further directed by your surgeon. o Avoid periods of inactivity such as sitting longer than an hour when not asleep. This helps prevent blood clots.  o You may return to work once you are authorized by your doctor.     WEIGHT BEARING   Weight bearing as tolerated with assist device (walker, cane, etc) as directed, use it as long as suggested by your surgeon or therapist, typically at least 4-6 weeks.   EXERCISES  Results after joint replacement surgery are often greatly improved when you follow the exercise, range of motion and muscle strengthening exercises prescribed by your doctor. Safety measures are also important to protect the joint from further injury. Any time any of these exercises cause you to have increased pain or swelling, decrease what you are doing until you are comfortable again and then slowly increase them. If you have problems or questions, call your caregiver or physical therapist for advice.   Rehabilitation is important following a joint replacement. After just a few days of immobilization, the muscles of the leg can become weakened and shrink (atrophy).  These exercises are designed to build up the tone and strength of the thigh and leg muscles and to improve motion. Often times heat used for twenty to thirty minutes before working out will loosen up your tissues and help with improving the range of motion but do not use heat for the first two weeks following surgery (sometimes heat can increase post-operative swelling).   These exercises can be done on a training (exercise) mat, on the floor, on a table or on a bed. Use whatever works the best and is most comfortable for you.    Use music or television while you are exercising so that the exercises are a pleasant break in your day. This will make your life better with the exercises acting as a break in your routine that  you can look forward to.   Perform all exercises about fifteen times, three times per day or as directed.  You should exercise both the operative leg and the other leg as well.  Exercises include:    Quad Sets - Tighten up the muscle on the front of the thigh (Quad) and hold for 5-10 seconds.    Straight Leg Raises - With your knee straight (if you were given a brace, keep it on), lift the leg to 60 degrees, hold for 3 seconds, and slowly lower the leg.  Perform this exercise against resistance later as your leg gets stronger.   Leg Slides: Lying on your back, slowly slide your foot toward your buttocks, bending your knee up off the floor (only go as far as is comfortable). Then slowly slide your foot back down until your leg is flat on the floor again.   Angel Wings: Lying on your back spread your legs to the side as far apart as you can without causing discomfort.   Hamstring Strength:  Lying on your back, push your heel against the floor with your leg straight by tightening up the muscles of your buttocks.  Repeat, but this time bend your knee to a comfortable angle, and push your heel against the floor.  You may put a pillow under the heel to make it more comfortable if necessary.   A rehabilitation program following joint replacement surgery can speed recovery and prevent re-injury in the future due to weakened muscles. Contact your doctor or a physical therapist for more information on knee rehabilitation.    CONSTIPATION  Constipation is defined medically as fewer than three stools per week and severe constipation as less than one stool per week.  Even if you have a regular bowel pattern at home, your normal regimen is likely to be disrupted due to multiple reasons following surgery.  Combination of anesthesia, postoperative narcotics, change in appetite and fluid intake all can affect your bowels.   YOU MUST use at least one of the following options; they are listed in order of  increasing strength to get the job done.  They are all available over the counter, and you may need to use some, POSSIBLY even all of these options:    Drink plenty of fluids (prune juice may be helpful) and high fiber foods Colace 100 mg by mouth twice a day  Senokot for constipation as directed and as needed Dulcolax (bisacodyl), take with full glass of water  Miralax (polyethylene glycol) once or twice a day as needed.  If you have tried all these things and are unable to have a bowel movement in the first 3-4 days after surgery call either your surgeon or your primary doctor.    If you experience loose stools or diarrhea, hold the medications until you stool forms back up.  If your symptoms do not get better within 1 week or if they get worse, check with your doctor.  If you experience "the worst abdominal pain ever" or develop nausea or vomiting, please contact the office immediately for further recommendations for treatment.   ITCHING:  If you experience itching with your medications, try taking only a single pain pill, or even half a pain pill at a time.  You can also use Benadryl over the counter for itching or also to help with sleep.   TED HOSE STOCKINGS:  Use stockings on both legs until for at least 2 weeks or as directed by physician office. They may be removed at night for sleeping.  MEDICATIONS:  See your medication summary on the After Visit Summary that nursing will review with you.  You may have some home medications which will be placed on hold until you complete the course of blood thinner medication.  It is important for you to complete the blood thinner medication as prescribed.   PRECAUTIONS:  If you experience chest pain or shortness of breath - call 911 immediately for transfer to the hospital emergency department.   If you develop a fever greater that 101 F, purulent drainage from wound, increased redness or drainage from wound, foul odor from the wound/dressing, or  calf pain - CONTACT YOUR SURGEON.                                                   FOLLOW-UP APPOINTMENTS:  If you do not already have a  post-op appointment, please call the office for an appointment to be seen by your surgeon.  Guidelines for how soon to be seen are listed in your After Visit Summary, but are typically between 1-4 weeks after surgery.   MAKE SURE YOU:   Understand these instructions.   Get help right away if you are not doing well or get worse.    Thank you for letting us be a part of your medical care team.  It is a privilege we respect greatly.  We hope these instructions will help you stay on track for a fast and full recovery!

## 2015-03-06 NOTE — Progress Notes (Signed)
VASCULAR LAB PRELIMINARY  PRELIMINARY  PRELIMINARY  PRELIMINARY  Left lower extremity venous duplex completed.    Preliminary report:  There is no DVT or SVT noted in the left lower extremity.  There is significant interstitial fluid noted throughout the left calf.   Emya Picado, RVT 03/06/2015, 11:04 AM

## 2015-03-06 NOTE — Progress Notes (Signed)
PT Cancellation Note  Patient Details Name: Destiny Rocha MRN: [id number] DOB: 06-13-1955   Cancelled Treatment:    Reason Eval/Treat Not Completed: Medical issues which prohibited therapy (doppler to R/O DVT pending)   [facility] 03/06/2015, 9:07 AM

## 2015-03-08 NOTE — Anesthesia Postprocedure Evaluation (Signed)
Anesthesia Post Note  Patient: Destiny Rocha  Procedure(s) Performed: Procedure(s) (LRB): LEFT TOTAL KNEE ARTHROPLASTY (Left)  Patient location during evaluation: PACU Anesthesia Type: Spinal Level of consciousness: awake and alert Pain management: pain level controlled Vital Signs Assessment: post-procedure vital signs reviewed and stable Respiratory status: spontaneous breathing, nonlabored ventilation, respiratory function stable and patient connected to nasal cannula oxygen Cardiovascular status: blood pressure returned to baseline and stable Postop Assessment: no signs of nausea or vomiting Anesthetic complications: no    Last Vitals:  Filed Vitals:   03/05/15 2043 03/06/15 0547  BP: 108/64 109/65  Pulse: 90 77  Temp: 37 C 37.1 C  Resp: 18 18    Last Pain:  Filed Vitals:   03/06/15 1441  PainSc: 5                  Patte Winkel L

## 2015-03-11 ENCOUNTER — Ambulatory Visit (HOSPITAL_COMMUNITY)
Admission: RE | Admit: 2015-03-11 | Discharge: 2015-03-11 | Disposition: A | Discharge status: Home / Self Care | Payer: 59 | Source: Ambulatory Visit | Attending: Cardiovascular Disease | Admitting: Cardiovascular Disease

## 2015-03-11 ENCOUNTER — Other Ambulatory Visit (HOSPITAL_COMMUNITY): Payer: Self-pay | Admitting: Orthopedic Surgery

## 2015-03-11 DIAGNOSIS — M7989 Other specified soft tissue disorders: Secondary | ICD-10-CM | POA: Insufficient documentation

## 2015-03-11 DIAGNOSIS — M79605 Pain in left leg: Secondary | ICD-10-CM

## 2015-12-18 ENCOUNTER — Encounter (HOSPITAL_COMMUNITY): Payer: Self-pay

## 2015-12-18 ENCOUNTER — Emergency Department (HOSPITAL_COMMUNITY): Payer: 59

## 2015-12-18 ENCOUNTER — Emergency Department (HOSPITAL_COMMUNITY)
Admission: EM | Admit: 2015-12-18 | Discharge: 2015-12-18 | Disposition: A | Discharge status: Home / Self Care | Payer: 59 | Attending: Emergency Medicine | Admitting: Emergency Medicine

## 2015-12-18 DIAGNOSIS — Z7901 Long term (current) use of anticoagulants: Secondary | ICD-10-CM | POA: Insufficient documentation

## 2015-12-18 DIAGNOSIS — Z96652 Presence of left artificial knee joint: Secondary | ICD-10-CM | POA: Diagnosis not present

## 2015-12-18 DIAGNOSIS — S161XXA Strain of muscle, fascia and tendon at neck level, initial encounter: Secondary | ICD-10-CM | POA: Diagnosis not present

## 2015-12-18 DIAGNOSIS — M25532 Pain in left wrist: Secondary | ICD-10-CM

## 2015-12-18 DIAGNOSIS — M25512 Pain in left shoulder: Secondary | ICD-10-CM

## 2015-12-18 DIAGNOSIS — M25562 Pain in left knee: Secondary | ICD-10-CM | POA: Diagnosis not present

## 2015-12-18 DIAGNOSIS — Y939 Activity, unspecified: Secondary | ICD-10-CM | POA: Insufficient documentation

## 2015-12-18 DIAGNOSIS — Y999 Unspecified external cause status: Secondary | ICD-10-CM | POA: Diagnosis not present

## 2015-12-18 DIAGNOSIS — S199XXA Unspecified injury of neck, initial encounter: Secondary | ICD-10-CM | POA: Diagnosis present

## 2015-12-18 DIAGNOSIS — Z79899 Other long term (current) drug therapy: Secondary | ICD-10-CM | POA: Insufficient documentation

## 2015-12-18 DIAGNOSIS — Y92481 Parking lot as the place of occurrence of the external cause: Secondary | ICD-10-CM | POA: Diagnosis not present

## 2015-12-18 MED ORDER — CYCLOBENZAPRINE HCL 10 MG PO TABS: 10.0000 mg | ORAL_TABLET | Freq: Two times a day (BID) | ORAL | 0 refills | Status: DC | PRN

## 2015-12-18 NOTE — Discharge Instructions (Signed)
All of your imaging has been normal today. Most likely musculoskeletal pain. May take ibuprofen and Tylenol for pain. Use Flexeril as needed for muscle spasms. Use heat for pain. Get plenty of rest. Please return to ED if your symptoms worsen or if you develop headache or vision changes. Follow up with primary care doctor if symptoms do not improve.
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
## 2016-04-20 DIAGNOSIS — M179 Osteoarthritis of knee, unspecified: Secondary | ICD-10-CM | POA: Diagnosis not present

## 2016-04-20 DIAGNOSIS — R7303 Prediabetes: Secondary | ICD-10-CM | POA: Diagnosis not present

## 2016-04-20 DIAGNOSIS — E785 Hyperlipidemia, unspecified: Secondary | ICD-10-CM | POA: Diagnosis not present

## 2016-05-02 DIAGNOSIS — J069 Acute upper respiratory infection, unspecified: Secondary | ICD-10-CM | POA: Diagnosis not present

## 2016-05-02 DIAGNOSIS — R42 Dizziness and giddiness: Secondary | ICD-10-CM | POA: Diagnosis not present

## 2016-05-11 DIAGNOSIS — Z23 Encounter for immunization: Secondary | ICD-10-CM | POA: Diagnosis not present

## 2016-05-11 DIAGNOSIS — Z01419 Encounter for gynecological examination (general) (routine) without abnormal findings: Secondary | ICD-10-CM | POA: Diagnosis not present

## 2016-05-11 DIAGNOSIS — R21 Rash and other nonspecific skin eruption: Secondary | ICD-10-CM | POA: Diagnosis not present

## 2016-05-24 DIAGNOSIS — R21 Rash and other nonspecific skin eruption: Secondary | ICD-10-CM | POA: Diagnosis not present

## 2016-07-08 DIAGNOSIS — Z1231 Encounter for screening mammogram for malignant neoplasm of breast: Secondary | ICD-10-CM | POA: Diagnosis not present

## 2016-08-18 DIAGNOSIS — H01004 Unspecified blepharitis left upper eyelid: Secondary | ICD-10-CM | POA: Diagnosis not present

## 2016-08-18 DIAGNOSIS — H04123 Dry eye syndrome of bilateral lacrimal glands: Secondary | ICD-10-CM | POA: Diagnosis not present

## 2016-08-18 DIAGNOSIS — H01001 Unspecified blepharitis right upper eyelid: Secondary | ICD-10-CM | POA: Diagnosis not present

## 2016-08-31 DIAGNOSIS — M179 Osteoarthritis of knee, unspecified: Secondary | ICD-10-CM | POA: Diagnosis not present

## 2016-11-29 DIAGNOSIS — R233 Spontaneous ecchymoses: Secondary | ICD-10-CM | POA: Diagnosis not present

## 2016-11-29 DIAGNOSIS — D171 Benign lipomatous neoplasm of skin and subcutaneous tissue of trunk: Secondary | ICD-10-CM | POA: Diagnosis not present

## 2016-11-29 DIAGNOSIS — K068 Other specified disorders of gingiva and edentulous alveolar ridge: Secondary | ICD-10-CM | POA: Diagnosis not present

## 2016-12-21 DIAGNOSIS — R221 Localized swelling, mass and lump, neck: Secondary | ICD-10-CM | POA: Diagnosis not present

## 2017-04-26 DIAGNOSIS — M79642 Pain in left hand: Secondary | ICD-10-CM | POA: Diagnosis not present

## 2017-05-31 ENCOUNTER — Other Ambulatory Visit: Payer: Self-pay | Admitting: Nurse Practitioner

## 2017-05-31 ENCOUNTER — Other Ambulatory Visit (HOSPITAL_COMMUNITY)
Admission: RE | Admit: 2017-05-31 | Discharge: 2017-05-31 | Disposition: A | Discharge status: Home / Self Care | Payer: 59 | Source: Ambulatory Visit | Attending: Nurse Practitioner | Admitting: Nurse Practitioner

## 2017-05-31 DIAGNOSIS — Z01419 Encounter for gynecological examination (general) (routine) without abnormal findings: Secondary | ICD-10-CM | POA: Insufficient documentation

## 2017-06-05 LAB — CYTOLOGY - PAP
Adequacy: ABSENT
Diagnosis: NEGATIVE
HPV (WINDOPATH): NOT DETECTED

## 2017-06-20 DIAGNOSIS — Z6841 Body Mass Index (BMI) 40.0 and over, adult: Secondary | ICD-10-CM | POA: Diagnosis not present

## 2017-06-20 DIAGNOSIS — B351 Tinea unguium: Secondary | ICD-10-CM | POA: Diagnosis not present

## 2017-07-16 DIAGNOSIS — M7632 Iliotibial band syndrome, left leg: Secondary | ICD-10-CM | POA: Diagnosis not present

## 2017-07-16 DIAGNOSIS — Z96652 Presence of left artificial knee joint: Secondary | ICD-10-CM | POA: Diagnosis not present

## 2017-07-16 DIAGNOSIS — Z471 Aftercare following joint replacement surgery: Secondary | ICD-10-CM | POA: Diagnosis not present

## 2017-07-16 DIAGNOSIS — M1711 Unilateral primary osteoarthritis, right knee: Secondary | ICD-10-CM | POA: Diagnosis not present

## 2017-07-16 DIAGNOSIS — M1712 Unilateral primary osteoarthritis, left knee: Secondary | ICD-10-CM | POA: Diagnosis not present

## 2017-07-20 DIAGNOSIS — Z6841 Body Mass Index (BMI) 40.0 and over, adult: Secondary | ICD-10-CM | POA: Diagnosis not present

## 2017-07-20 DIAGNOSIS — R7303 Prediabetes: Secondary | ICD-10-CM | POA: Diagnosis not present

## 2017-07-20 DIAGNOSIS — E7800 Pure hypercholesterolemia, unspecified: Secondary | ICD-10-CM | POA: Diagnosis not present

## 2017-07-20 DIAGNOSIS — R5383 Other fatigue: Secondary | ICD-10-CM | POA: Diagnosis not present

## 2017-07-20 DIAGNOSIS — Z0001 Encounter for general adult medical examination with abnormal findings: Secondary | ICD-10-CM | POA: Diagnosis not present

## 2017-09-07 DIAGNOSIS — N898 Other specified noninflammatory disorders of vagina: Secondary | ICD-10-CM | POA: Diagnosis not present

## 2017-09-07 DIAGNOSIS — J302 Other seasonal allergic rhinitis: Secondary | ICD-10-CM | POA: Diagnosis not present

## 2017-09-07 DIAGNOSIS — N9089 Other specified noninflammatory disorders of vulva and perineum: Secondary | ICD-10-CM | POA: Diagnosis not present

## 2017-09-07 DIAGNOSIS — Z01411 Encounter for gynecological examination (general) (routine) with abnormal findings: Secondary | ICD-10-CM | POA: Diagnosis not present

## 2017-09-10 ENCOUNTER — Other Ambulatory Visit: Payer: Self-pay | Admitting: Family Medicine

## 2017-09-10 DIAGNOSIS — Z01411 Encounter for gynecological examination (general) (routine) with abnormal findings: Secondary | ICD-10-CM

## 2017-09-12 DIAGNOSIS — J329 Chronic sinusitis, unspecified: Secondary | ICD-10-CM | POA: Diagnosis not present

## 2017-09-13 DIAGNOSIS — Z1231 Encounter for screening mammogram for malignant neoplasm of breast: Secondary | ICD-10-CM | POA: Diagnosis not present

## 2017-09-27 ENCOUNTER — Other Ambulatory Visit: Payer: 59

## 2017-11-01 DIAGNOSIS — B029 Zoster without complications: Secondary | ICD-10-CM | POA: Diagnosis not present

## 2017-11-12 DIAGNOSIS — Z1211 Encounter for screening for malignant neoplasm of colon: Secondary | ICD-10-CM | POA: Diagnosis not present

## 2017-11-12 DIAGNOSIS — K5730 Diverticulosis of large intestine without perforation or abscess without bleeding: Secondary | ICD-10-CM | POA: Diagnosis not present

## 2017-11-12 DIAGNOSIS — D126 Benign neoplasm of colon, unspecified: Secondary | ICD-10-CM | POA: Diagnosis not present

## 2017-11-25 ENCOUNTER — Other Ambulatory Visit: Payer: Self-pay

## 2017-11-25 ENCOUNTER — Emergency Department (HOSPITAL_COMMUNITY)
Admission: EM | Admit: 2017-11-25 | Discharge: 2017-11-25 | Disposition: A | Discharge status: Home / Self Care | Payer: 59 | Attending: Emergency Medicine | Admitting: Emergency Medicine

## 2017-11-25 DIAGNOSIS — Z7901 Long term (current) use of anticoagulants: Secondary | ICD-10-CM | POA: Insufficient documentation

## 2017-11-25 DIAGNOSIS — M5441 Lumbago with sciatica, right side: Secondary | ICD-10-CM | POA: Diagnosis not present

## 2017-11-25 DIAGNOSIS — M545 Low back pain: Secondary | ICD-10-CM | POA: Diagnosis present

## 2017-11-25 DIAGNOSIS — R109 Unspecified abdominal pain: Secondary | ICD-10-CM | POA: Diagnosis not present

## 2017-11-25 DIAGNOSIS — Z79899 Other long term (current) drug therapy: Secondary | ICD-10-CM | POA: Insufficient documentation

## 2017-11-25 DIAGNOSIS — M5431 Sciatica, right side: Secondary | ICD-10-CM

## 2017-11-25 LAB — URINALYSIS, ROUTINE W REFLEX MICROSCOPIC
BACTERIA UA: NONE SEEN
BILIRUBIN URINE: NEGATIVE
Glucose, UA: NEGATIVE mg/dL
KETONES UR: NEGATIVE mg/dL
NITRITE: NEGATIVE
PH: 5 (ref 5.0–8.0)
Protein, ur: NEGATIVE mg/dL
SPECIFIC GRAVITY, URINE: 1.027 (ref 1.005–1.030)

## 2017-11-25 MED ORDER — PREDNISONE 10 MG PO TABS: 20.0000 mg | ORAL_TABLET | Freq: Every day | ORAL | 0 refills | Status: DC

## 2017-11-25 NOTE — Discharge Instructions (Signed)
Please return for any problem. Follow up with your regular provider as instructed.  °

## 2017-11-25 NOTE — ED Triage Notes (Addendum)
Patient arrives with c/o right sided back and flank pain. Patient was seen in MD office, MD thought it was new onset of shingles 2 weeks ago, started on acyclovir. Patient then started having back and right sided flank pain that radiates to groin. -N/V. Denies numbness or tingling. States bilateral lower feet are more swollen and is having frequency.

## 2017-11-25 NOTE — ED Provider Notes (Signed)
[facility] DEPT Provider Note   CSN: [id number] Arrival date & time: 11/25/17  1519     History   Chief Complaint Chief Complaint  Patient presents with  . Back Pain  . Flank Pain    HPI Destiny Rocha is a 62 y.o. female.  62 year old female with prior medical history as documented below presents with complaint of right-sided low back pain.  Patient reports intermittent pain for the last several weeks.  Symptoms were worse over the last several days.  She describes pain that is dull and achy.  Pain radiates to the right knee.  She reports that the pain is worse with heavy lifting.  She denies associated nausea, vomiting, flank pain, chest pain, shortness of breath, or other acute complaint.  She is tried taking Aleve at home without significant improvement in her symptoms.  The history is provided by the patient and medical records.  Back Pain   This is a new problem. The current episode started more than 1 week ago. The problem occurs daily. The problem has not changed since onset.The pain is associated with no known injury. The pain is present in the lumbar spine. The quality of the pain is described as aching. The pain does not radiate. The pain is mild. The symptoms are aggravated by twisting, bending and certain positions. Pertinent negatives include no chest pain, no fever, no abdominal pain, no abdominal swelling, no bladder incontinence, no dysuria, no paresthesias and no paresis. She has tried nothing for the symptoms.  Flank Pain  Pertinent negatives include no chest pain and no abdominal pain.    Past Medical History:  Diagnosis Date  . Anxiety   . Arthritis   . Complication of anesthesia    slow to awaken  . Dry eyes   . Eczema   . Elevated cholesterol   . GERD (gastroesophageal reflux disease)    occasional  . Headache    sinus  . Peripheral vascular disease ([place])   . Pneumonia as child  . PONV (postoperative nausea and  vomiting)     Patient Active Problem List   Diagnosis Date Noted  . Primary osteoarthritis of left knee 03/04/2015  . Intraductal papilloma of left breast 10/06/2013  . Osteoarthritis of both knees 09/08/2013  . Morbid obesity ([place]) 09/08/2013    Past Surgical History:  Procedure Laterality Date  . BREAST BIOPSY Left june 2016   normal, being checked every 6 months has breast marker  . CESAREAN SECTION     x 2  . KNEE SURGERY Bilateral    arthroscopy  . TOTAL KNEE ARTHROPLASTY Left 03/04/2015   Procedure: LEFT TOTAL KNEE ARTHROPLASTY;  Surgeon: Rod Can, MD;  Location: [facility];  Service: Orthopedics;  Laterality: Left;     OB History   None      Home Medications    Prior to Admission medications   Medication Sig Start Date End Date Taking? Authorizing Provider  apixaban (ELIQUIS) 2.5 MG TABS tablet Take 1 tablet (2.5 mg total) by mouth 2 (two) times daily. 03/05/15   Swinteck, Aaron Edelman, MD  atorvastatin (LIPITOR) 20 MG tablet Take 20 mg by mouth daily at 6 PM.    [provider]  Biotin 5000 MCG TABS Take 5,000 mcg by mouth daily.    [provider]  cholecalciferol (VITAMIN D) 1000 UNITS tablet Take 1,000 Units by mouth every Monday, Wednesday, and Friday.    [provider]  cyclobenzaprine (FLEXERIL) 10 MG tablet  Take 1 tablet (10 mg total) by mouth 2 (two) times daily as needed for muscle spasms. 12/18/15   Doristine Devoid, PA-C  docusate sodium (COLACE) 100 MG capsule Take 1 capsule (100 mg total) by mouth 2 (two) times daily. 03/05/15   Swinteck, Aaron Edelman, MD  Glucosamine-Chondroitin 500-400 MG CAPS 1 tab by mouth TID Patient taking differently: Take 1 tablet by mouth daily.  01/21/14   Silverio Decamp, MD  HYDROcodone-acetaminophen (NORCO) 5-325 MG tablet Take 1-2 tablets by mouth every 4 (four) hours as needed for moderate pain. 03/05/15   Swinteck, Aaron Edelman, MD  L-Lysine 1000 MG TABS Take 1,000 mg by mouth daily.    [provider]  Melatonin 3 MG CAPS Take 3 mg by mouth at bedtime.    [provider]  methocarbamol (ROBAXIN) 500 MG tablet Take 1 tablet (500 mg total) by mouth every 6 (six) hours as needed for muscle spasms. 03/05/15   Swinteck, Aaron Edelman, MD  metoCLOPramide (REGLAN) 5 MG tablet Take 1-2 tablets (5-10 mg total) by mouth every 8 (eight) hours as needed for nausea (if ondansetron (ZOFRAN) ineffective.). 03/06/15   Ardeen Jourdain, PA-C  Multiple Vitamin (MULTIVITAMIN) tablet Take 1 tablet by mouth every other day.     [provider]  ondansetron (ZOFRAN) 4 MG tablet Take 1 tablet (4 mg total) by mouth every 6 (six) hours as needed for nausea. 03/05/15   Swinteck, Aaron Edelman, MD  Phenylephrine-Guaifenesin 5-200 MG TABS Take 1 tablet by mouth 2 (two) times daily as needed (cold symptoms).    [provider]  predniSONE (DELTASONE) 10 MG tablet Take 2 tablets (20 mg total) by mouth daily. 11/25/17   Valarie Merino, MD  predniSONE (DELTASONE) 10 MG tablet Take 2 tablets (20 mg total) by mouth daily. 11/25/17   Valarie Merino, MD  pregabalin (LYRICA) 75 MG capsule Take 1 capsule (75 mg total) by mouth 2 (two) times daily. 03/05/15   Swinteck, Aaron Edelman, MD  senna (SENOKOT) 8.6 MG TABS tablet Take 2 tablets (17.2 mg total) by mouth at bedtime. 03/05/15   Swinteck, Aaron Edelman, MD  UNABLE TO FIND Electrolyte stamina magnesium, potassium, vit b 6. concen trace 2-4 tabs per day    [provider]    Family History No family history on file.  Social History Social History   Tobacco Use  . Smoking status: Never Smoker  . Smokeless tobacco: Never Used  Substance Use Topics  . Alcohol use: Yes    Comment: occasional wine  . Drug use: No     Allergies   Morphine and related   Review of Systems Review of Systems  Constitutional: Negative for fever.  Cardiovascular: Negative for chest pain.  Gastrointestinal: Negative for abdominal pain.  Genitourinary: Positive for flank  pain. Negative for bladder incontinence and dysuria.  Musculoskeletal: Positive for back pain.  Neurological: Negative for paresthesias.  All other systems reviewed and are negative.    Physical Exam Updated Vital Signs BP 130/77   Pulse 70   Temp 98 F (36.7 C) (Oral)   Resp 18   Ht 5\' 7"  (1.702 m)   Wt 125.6 kg   SpO2 98%   BMI 43.38 kg/m   Physical Exam  Constitutional: She is oriented to person, place, and time. She appears well-developed and well-nourished. No distress.  HENT:  Head: Normocephalic and atraumatic.  Mouth/Throat: Oropharynx is clear and moist.  Eyes: Pupils are equal, round, and reactive to light. Conjunctivae and EOM are normal.  Neck: Normal range of motion. Neck supple.  Cardiovascular: Normal rate, regular rhythm and normal heart sounds.  Pulmonary/Chest: Effort normal and breath sounds normal. No respiratory distress.  Abdominal: Soft. She exhibits no distension. There is no tenderness.  Musculoskeletal: Normal range of motion. She exhibits no edema or deformity.  Localized tenderness to right low back - into the right buttock - consistent with sciatic pain   Neurological: She is alert and oriented to person, place, and time.  Straight leg raise (bilaterally) is negative  5/5 strength to BLE    Skin: Skin is warm and dry.  Psychiatric: She has a normal mood and affect.  Nursing note and vitals reviewed.    ED Treatments / Results  Labs (all labs ordered are listed, but only abnormal results are displayed) Labs Reviewed  URINALYSIS, ROUTINE W REFLEX MICROSCOPIC - Abnormal; Notable for the following components:      Result Value   APPearance HAZY (*)    Hgb urine dipstick SMALL (*)    Leukocytes, UA LARGE (*)    All other components within normal limits    EKG None  Radiology No results found.  Procedures Procedures (including critical care time)  Medications Ordered in ED Medications - No data to display   Initial Impression  / Assessment and Plan / ED Course  I have reviewed the triage vital signs and the nursing notes.  Pertinent labs & imaging results that were available during my care of the patient were reviewed by me and considered in my medical decision making (see chart for details).     MDM  Screen complete  Patient is presenting with right low back pain - presentation is consistent with likely sciatica.   Patient is very concerned about the cost associated with an ED workup. She consents to a UA. She declines IM or PO pain meds. She declines imaging studies.   She is aware of the need for close Follow up. Strict return precautions given and understood.   She has an appointment with her Primary physician tomorrow.      Final Clinical Impressions(s) / ED Diagnoses   Final diagnoses:  Sciatica of right side    ED Discharge Orders         Ordered    predniSONE (DELTASONE) 10 MG tablet  Daily     11/25/17 1900    predniSONE (DELTASONE) 10 MG tablet  Daily     11/25/17 1900           Valarie Merino, MD 11/25/17 1944

## 2017-11-26 DIAGNOSIS — M5441 Lumbago with sciatica, right side: Secondary | ICD-10-CM | POA: Diagnosis not present

## 2018-01-17 ENCOUNTER — Inpatient Hospital Stay: Payer: 59

## 2018-01-17 ENCOUNTER — Inpatient Hospital Stay: Payer: 59 | Attending: Genetic Counselor | Admitting: Genetic Counselor

## 2018-01-17 DIAGNOSIS — Z803 Family history of malignant neoplasm of breast: Secondary | ICD-10-CM | POA: Diagnosis not present

## 2018-01-17 DIAGNOSIS — Z800 Family history of malignant neoplasm of digestive organs: Secondary | ICD-10-CM | POA: Diagnosis not present

## 2018-01-17 DIAGNOSIS — Z1379 Encounter for other screening for genetic and chromosomal anomalies: Secondary | ICD-10-CM | POA: Diagnosis not present

## 2018-01-17 NOTE — Progress Notes (Signed)
REFERRING PROVIDER: Gavin Pound, MD [place], [place] [address]  PRIMARY PROVIDER:  Gavin Pound, MD  PRIMARY REASON FOR VISIT:  1. Family history of breast cancer   2. Family history of colon cancer      HISTORY OF PRESENT ILLNESS:   Destiny Rocha, a 62 y.o. female, was seen for a [facility] cancer genetics consultation at the request of Dr. Orland Rocha due to a family history of cancer.  Destiny Rocha presents to clinic today to discuss the possibility of a hereditary predisposition to cancer, genetic testing, and to further clarify her future cancer risks, as well as potential cancer risks for family members.   Destiny Rocha is a 62 y.o. female with no personal history of cancer.  Her daughter was diagnosed with breast cancer at 78 and was found to have an ATM mutation.  CANCER HISTORY:   No history exists.     HORMONAL RISK FACTORS:  Menarche was at age 30-12.  First live birth at age 74.  OCP use for approximately 15-20 years.  Ovaries intact: yes.  Hysterectomy: no.  Menopausal status: postmenopausal.  HRT use: 0 years. Colonoscopy: yes; a few polyps, now on 5 year schedule. Mammogram within the last year: yes. Number of breast biopsies: 1. Up to date with pelvic exams:  yes. Any excessive radiation exposure in the past:  no  Past Medical History:  Diagnosis Date  . Anxiety   . Arthritis   . Complication of anesthesia    slow to awaken  . Dry eyes   . Eczema   . Elevated cholesterol   . Family history of breast cancer   . Family history of colon cancer   . GERD (gastroesophageal reflux disease)    occasional  . Headache    sinus  . Peripheral vascular disease ([place])   . Pneumonia as child  . PONV (postoperative nausea and vomiting)     Past Surgical History:  Procedure Laterality Date  . BREAST BIOPSY Left june 2016   normal, being checked every 6 months has breast marker  . CESAREAN SECTION     x 2  . KNEE SURGERY Bilateral    arthroscopy  .  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 Marital status: Married    Spouse name: Not on file  . Number of children: Not on file  . Years of education: Not on file  . Highest education level: Not on file  Occupational History  . Not on file  Social Needs  . Financial resource strain: Not on file  . Food insecurity:    Worry: Not on file    Inability: Not on file  . Transportation needs:    Medical: Not on file    Non-medical: Not on file  Tobacco Use  . Smoking status: Never Smoker  . Smokeless tobacco: Never Used  Substance and Sexual Activity  . Alcohol use: Yes    Comment: occasional wine  . Drug use: No  . Sexual activity: Not on file  Lifestyle  . Physical activity:    Days per week: Not on file    Minutes per session: Not on file  . Stress: Not on file  Relationships  . Social connections:    Talks on phone: Not on file    Gets together:  Not on file    Attends religious service: Not on file    Active member of club or organization: Not on file    Attends meetings of clubs or organizations: Not on file    Relationship status: Not on file  Other Topics Concern  . Not on file  Social History Narrative  . Not on file     FAMILY HISTORY:  We obtained a detailed, 4-generation family history.  Significant diagnoses are listed below: Family History  Problem Relation Age of Onset  . Colon cancer Mother        dx in her late 72s-60s  . Cancer Father        d. 58s from unknown cancer  . HIV/AIDS Brother        d. 21s  . Stroke Maternal Uncle        d. 28  . Seizures Sister        d. 33s  . Stroke Brother   . Breast cancer Daughter 7        Stage IV; ATM pos  . Cancer Maternal Uncle        unknonw d. 5s  . Breast cancer Cousin        dx. 41s  . Breast cancer Other         brother's daughter, dx under 29    The patient has two daughters, one who developed breast cancer at 11 and has an ATM mutation.  She has five maternal half siblings - two sisters and three brothers.  One sister died in her 83's form possible seizures.  One brother passed from HIV/AIDS and another brother died of a stroke.  This brother has a daughter who had breast cancer under 79.  The patient's parents are both deceased.  The patient's mother had colon cancer in her 54's-60's.  She had two maternal half brothers, one who raised the patient.  The uncle who raised the patient died of a stroke.  The other uncle died of an unknown cancer and had a daughter who had breast cancer under 64's.  The maternal grandparents are deceased.  The father died of an unknown cancer in his 65's.  He had several siblings, but the patient is not in contact with this side and there is limited information.  Destiny Rocha is aware of previous family history of genetic testing for hereditary cancer risks. Patient's maternal ancestors are of African American descent, and paternal ancestors are of African American descent. There is no reported Ashkenazi Jewish ancestry. There is no known consanguinity.  GENETIC COUNSELING ASSESSMENT: Destiny Rocha is a 62 y.o. 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 We, therefore, discussed and recommended the following at today's visit.   DISCUSSION: We discussed that about 5-10% of breast cancer due to hereditary cancer syndromes, many times due to mutations within BRCA1 and BRCA2.  Other genes have an increased risk for hereditary breast cancer including ATM, CHEK2 and PALB2.  Her daughter had an ATM mutation called [id number].  The patient has a 50% chance of having this same mutation, and with a family history of breast cancer it is plausible that  she could test positive.  We discussed ATM mutations and concerns we have.    Based on Destiny Rocha's family history of cancer, she meets medical criteria for genetic testing. Despite that she meets criteria,  she may still have an out of pocket cost. We discussed that if her out of pocket cost for testing is over $100, the laboratory will call and confirm whether she wants to proceed with testing.  If the out of pocket cost of testing is less than $100 she will be billed by the genetic testing laboratory. Her daughter was tested through [facility].  [facility] has a policy that family members tested within 60 days can have complimentary testing for the known mutation.  Destiny Rocha is outside of this time frame by about 13 days.  I have contacted the representative for the laboratory and he will help get the testing covered under their policy.  DISCUSSION: The ATM gene is involved in the detection and surveillance of DNA damage.  ATM phosphorylation of BRCA1 is critical for proper response to DNA double-strand breaks.  This is believed to be the reason for the role ATM has in breast cancer risk.  Individuals with a ATM mutation are at a greater risk for having children with Ataxia-telangiectasia (A-T).  AT is characterized by progressive cerebellar degeneration (ataxia), dilated blood vessels in the eyes and skin (telangiectasia), immunodeficiency, chromosomal instability, increased sensitivity to ionizing radiation and a predisposition to lymphoma and leukemia.  Therefore, individuals of childbearing age who have a known ATM mutation may want to consider having their spouse tested to determine their risk for having a child with A-T.  Studies of these families demonstrated increased incident of breast cancer in the mothers (who are heterozygous carriers) of the affected children, thus prompting further evaluation of the relationship between breast cancer and ATM.  Women who are heterozygous ATM carriers have  an increase breast cancer risk.  They have 5-fold increased risk of breast cancer <50 years, and 2-3 fold increased risk for breast cancer overall.  In families with familial breast cancer that were negative for BRCA1 or BRCA2 genes, approximately 2.7% of women were found to have one ATM mutation.  In families with both breast cancer and leukemia, 6.7% of women were found to have one ATM mutation.  There has been some evidence that radiation treatment may increase the risk for breast cancer in the contralateral breast. Despite this risk, we do not recommend declining radiation treatment for her breast cancer if it is recommended, as the risk for having a recurrence of breast cancer based on not going through radiation may be greater than her risk for getting breast cancer again from the radiation.   Management for individuals with ATM mutations can be found in the NCCN guidelines (v.3.2019).  These guidelines recommend the following:  Breast Cancer     Screening: Annual mammogram with consideration of tomosynthesis and consider breast MRI with contrast starting at age 68 years.  Risk Reducing Mastectomy: Evidence insufficient, consider based on family history  Ovarian Cancer  No increased risk for ovarian cancer, therefore no recommendations  Other Cancer Risks  Unknown or insufficient evidence for pancreatic or prostate cancer risk  PLAN: After considering the risks, benefits, and limitations, Destiny Rocha  provided informed consent to pursue genetic testing and the blood sample was sent to [facility] for analysis of the ATM targeted mutation. Results should be available within approximately 2-3 weeks' time, at which point they will be disclosed by telephone to Destiny Rocha, as will any additional recommendations warranted by these results. Destiny Rocha will receive a summary of her genetic counseling visit and a copy of her results once available. This information will also be  available  in [place]. We encouraged Destiny Rocha to remain in contact with cancer genetics annually so that we can continuously update the family history and inform her of any changes in cancer genetics and testing that may be of benefit for her family. Destiny Rocha questions were answered to her satisfaction today. Our contact information was provided should additional questions or concerns arise.  Lastly, we encouraged Destiny Rocha to remain in contact with cancer genetics annually so that we can continuously update the family history and inform her of any changes in cancer genetics and testing that may be of benefit for this family.   Ms.  Rocha questions were answered to her satisfaction today. Our contact information was provided should additional questions or concerns arise. Thank you for the referral and allowing [name] to share in the care of your patient.   Karen P. Florene Glen, [place], [facility] Certified Genetic Counselor [email or web address] phone: [phone]  The patient was seen for a total of 45 minutes in face-to-face genetic counseling.  This patient was discussed with Drs. Magrinat, Lindi Adie and/or Burr Medico who agrees with the above.    _______________________________________________________________________ For Office Staff:  Number of people involved in session: 1 Was an Intern/ student involved with case: no

## 2018-01-30 ENCOUNTER — Telehealth: Payer: Self-pay | Admitting: Genetic Counselor

## 2018-01-30 NOTE — Telephone Encounter (Signed)
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  Scheduled an appointment for Thurs 11/21.

## 2018-02-07 ENCOUNTER — Inpatient Hospital Stay: Payer: 59 | Attending: Genetic Counselor | Admitting: Genetic Counselor

## 2018-02-07 DIAGNOSIS — Z1379 Encounter for other screening for genetic and chromosomal anomalies: Secondary | ICD-10-CM

## 2018-02-07 DIAGNOSIS — Z1589 Genetic susceptibility to other disease: Secondary | ICD-10-CM

## 2018-02-07 DIAGNOSIS — Z1509 Genetic susceptibility to other malignant neoplasm: Secondary | ICD-10-CM

## 2018-02-07 DIAGNOSIS — Z1501 Genetic susceptibility to malignant neoplasm of breast: Secondary | ICD-10-CM | POA: Diagnosis not present

## 2018-02-07 NOTE — Progress Notes (Signed)
GENETIC TEST RESULTS   Patient Name: KYLIE SIMMONDS Patient Age: 62 y.o. Encounter Date: 02/07/2018  Referring Provider: Gavin Pound, MD    Ms. Kocian was seen in the [facility] clinic on February 07, 2018 due to a family history of cancer and concern regarding a hereditary predisposition to cancer in the family. Please refer to the prior Genetics clinic note for more information regarding Ms. Randazzo's medical and family histories and our assessment at the time.   FAMILY HISTORY:  We obtained a detailed, 4-generation family history.  Significant diagnoses are listed below: Family History  Problem Relation Age of Onset  . Colon cancer Mother        dx in her late 47s-60s  . Cancer Father        d. 83s from unknown cancer  . HIV/AIDS Brother        d. 20s  . Stroke Maternal Uncle        d. 54  . Seizures Sister        d. 89s  . Stroke Brother   . Breast cancer Daughter 29        Stage IV; ATM pos  . Cancer Maternal Uncle        unknonw d. 38s  . Breast cancer Cousin        dx. 90s  . Breast cancer Other        brother's daughter, dx under 24    The patient has two daughters, one who developed breast cancer at 38 and has an ATM mutation.  She has five maternal half siblings - two sisters and three brothers.  One sister died in her 71's form possible seizures.  One brother passed from HIV/AIDS and another brother died of a stroke.  This brother has a daughter who had breast cancer under 29.  The patient's parents are both deceased.  The patient's mother had colon cancer in her 66's-60's.  She had two maternal half brothers, one who raised the patient.  The uncle who raised the patient died of a stroke.  The other uncle died of an unknown cancer and had a daughter who had breast cancer under 31's.  The maternal grandparents are deceased.  The father died of an unknown cancer in his 79's.  He had several siblings, but the patient is not in contact with this side and  there is limited information.  Ms. Byers is aware of previous family history of genetic testing for hereditary cancer risks. Patient's maternal ancestors are of African American descent, and paternal ancestors are of African American descent. There is no reported Ashkenazi Jewish ancestry. There is no known consanguinity.  GENETIC TESTING: At the time of Ms. Hangartner's visit, we recommended she pursue genetic testing for the specific ATM mutation that was identified in the family. The genetic testing January 28, 2018 through the [facility] offered by [facility] identified a single, heterozygous pathogenic gene mutation called ATM, [id number].   DISCUSSION: The ATM gene is involved in the detection and surveillance of DNA damage.  ATM phosphorylation of BRCA1 is critical for proper response to DNA double-strand breaks.  This is believed to be the reason for the role ATM has in breast cancer risk.  Individuals with a ATM mutation are at a greater risk for having children with Ataxia-telangiectasia (A-T).  AT is characterized by progressive cerebellar degeneration (ataxia), dilated blood vessels in the eyes and skin (telangiectasia), immunodeficiency, chromosomal instability, increased sensitivity to ionizing radiation and  a predisposition to lymphoma and leukemia.  Therefore, individuals of childbearing age who have a known ATM mutation may want to consider having their spouse tested to determine their risk for having a child with A-T.  Studies of these families demonstrated increased incident of breast cancer in the mothers (who are heterozygous carriers) of the affected children, thus prompting further evaluation of the relationship between breast cancer and ATM.  Women who are heterozygous ATM carriers have an increase breast cancer risk.  They have 5-fold increased risk of breast cancer <50 years, and 2-3 fold increased risk for breast cancer overall.  In families with familial  breast cancer that were negative for BRCA1 or BRCA2 genes, approximately 2.7% of women were found to have one ATM mutation.  In families with both breast cancer and leukemia, 6.7% of women were found to have one ATM mutation.  There has been some evidence that radiation treatment may increase the risk for breast cancer in the contralateral breast. Despite this risk, we do not recommend declining radiation treatment for her breast cancer if it is recommended, as the risk for having a recurrence of breast cancer based on not going through radiation may be greater than her risk for getting breast cancer again from the radiation.   Management for individuals with ATM mutations can be found in the NCCN guidelines (v.3.2019).  These guidelines recommend the following:  Breast Cancer     Screening: Annual mammogram with consideration of tomosynthesis and consider breast MRI with contrast starting at age 34 years.  Risk Reducing Mastectomy: Evidence insufficient, consider based on family history  A referral for the [facility] has been made for Dr. Truitt Merle.  Dr. Burr Medico can address the breast cancer risks as well as the concern for pancreatic cancer.  Ovarian Cancer  No increased risk for ovarian cancer, therefore no recommendations  Other Cancer Risks  Unknown or insufficient evidence for pancreatic or prostate cancer risk  FAMILY MEMBERS: It is important that all of Ms. Hartney's relatives (both men and women) know of the presence of this gene mutation. Site-specific genetic testing can sort out who in the family is at risk and who is not.   Ms. [place] daughter has a 50% chance to have inherited this mutation, and her maternal half siblings have a 25% chance for having inherited this mutation. We recommend they have genetic testing for this same mutation, as identifying the presence of this mutation would allow them to also take advantage of risk-reducing measures.   SUPPORT AND  RESOURCES: If Ms. Lupi is interested in ATM-specific information and support, there are two groups, Facing Our Risk (www.facingourrisk.com) and Bright Pink (www.brightpink.org) which some people have found useful. They provide opportunities to speak with other individuals from high-risk families. To locate genetic counselors in other cities, visit the website of the [employer] (ArtistMovie.se) and Secretary/administrator for a Social worker by zip code.  We encouraged Ms. Arntz to remain in contact with [name] on an annual basis so we can update her personal and family histories, and let her know of advances in cancer genetics that may benefit the family. Our contact number was provided. Ms. Romanoff questions were answered to her satisfaction today, and she knows she is welcome to call anytime with additional questions.   Izzak Fries P. Florene Glen, [place], [facility] Certified Genetic Counselor [email or web address] phone: [phone]

## 2018-03-18 DIAGNOSIS — Z6841 Body Mass Index (BMI) 40.0 and over, adult: Secondary | ICD-10-CM | POA: Diagnosis not present

## 2018-03-18 DIAGNOSIS — G479 Sleep disorder, unspecified: Secondary | ICD-10-CM | POA: Diagnosis not present

## 2018-03-18 DIAGNOSIS — R600 Localized edema: Secondary | ICD-10-CM | POA: Diagnosis not present

## 2018-03-25 ENCOUNTER — Telehealth: Payer: Self-pay | Admitting: Hematology

## 2018-03-25 DIAGNOSIS — J0100 Acute maxillary sinusitis, unspecified: Secondary | ICD-10-CM | POA: Diagnosis not present

## 2018-03-25 DIAGNOSIS — J029 Acute pharyngitis, unspecified: Secondary | ICD-10-CM | POA: Diagnosis not present

## 2018-03-25 NOTE — Telephone Encounter (Signed)
Lft vm to schedule pt for high clinic w/Dr. Burr Medico per Roma Kayser.

## 2018-03-28 ENCOUNTER — Encounter: Payer: Self-pay | Admitting: Hematology

## 2018-03-28 ENCOUNTER — Telehealth: Payer: Self-pay | Admitting: Hematology

## 2018-03-28 NOTE — Telephone Encounter (Signed)
A new high risk appt has been scheduled for the pt to see Dr. Burr Medico on 2/20 at 230pm. Pt agreed to the appt date and time. Letter mailed.

## 2018-04-30 ENCOUNTER — Encounter (INDEPENDENT_AMBULATORY_CARE_PROVIDER_SITE_OTHER): Payer: Self-pay

## 2018-05-07 ENCOUNTER — Ambulatory Visit (INDEPENDENT_AMBULATORY_CARE_PROVIDER_SITE_OTHER): Payer: 59 | Admitting: Bariatrics

## 2018-05-07 ENCOUNTER — Encounter (INDEPENDENT_AMBULATORY_CARE_PROVIDER_SITE_OTHER): Payer: Self-pay | Admitting: Bariatrics

## 2018-05-07 VITALS — BP 127/84 | HR 71 | Temp 98.3°F | Ht 67.0 in | Wt 278.0 lb

## 2018-05-07 DIAGNOSIS — E538 Deficiency of other specified B group vitamins: Secondary | ICD-10-CM

## 2018-05-07 DIAGNOSIS — E7849 Other hyperlipidemia: Secondary | ICD-10-CM

## 2018-05-07 DIAGNOSIS — Z6841 Body Mass Index (BMI) 40.0 and over, adult: Secondary | ICD-10-CM | POA: Diagnosis not present

## 2018-05-07 DIAGNOSIS — M1712 Unilateral primary osteoarthritis, left knee: Secondary | ICD-10-CM

## 2018-05-07 DIAGNOSIS — Z9189 Other specified personal risk factors, not elsewhere classified: Secondary | ICD-10-CM

## 2018-05-07 DIAGNOSIS — Z0289 Encounter for other administrative examinations: Secondary | ICD-10-CM

## 2018-05-07 DIAGNOSIS — Z1331 Encounter for screening for depression: Secondary | ICD-10-CM

## 2018-05-07 DIAGNOSIS — R0683 Snoring: Secondary | ICD-10-CM

## 2018-05-07 DIAGNOSIS — R5383 Other fatigue: Secondary | ICD-10-CM

## 2018-05-07 DIAGNOSIS — E559 Vitamin D deficiency, unspecified: Secondary | ICD-10-CM | POA: Diagnosis not present

## 2018-05-07 DIAGNOSIS — R0602 Shortness of breath: Secondary | ICD-10-CM | POA: Diagnosis not present

## 2018-05-07 NOTE — Progress Notes (Signed)
Office: [phone]  /  Fax: [phone]   Dear Dr. Orland Penman,   Thank you for referring Destiny Rocha to our clinic. The following note includes my evaluation and treatment recommendations.  HPI:   Chief Complaint: OBESITY    Destiny Rocha has been referred by Gavin Pound, MD for consultation regarding her obesity and obesity related comorbidities.    [name] (MR# [id number]) is a 63 y.o. female who presents on 05/07/2018 for obesity evaluation and treatment. Current BMI is Body mass index is 43.54 kg/m.Marland Rocha Destiny has been struggling with her weight for many years and has been unsuccessful in either losing weight, maintaining weight loss, or reaching her healthy weight goal.     Destiny Rocha attended our information session and states she is currently in the action stage of change and ready to dedicate time achieving and maintaining a healthier weight. Destiny Rocha is interested in becoming our patient and working on intensive lifestyle modifications including (but not limited to) diet, exercise and weight loss.    Destiny Rocha states she thinks her family will eat healthier with  her her desired weight loss is 98 to 103 lbs. she has been heavy most of  her life she started gaining weight in 1984 her heaviest weight ever was 280 lbs. 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 This has worsened with weight gain and has not worsened recently. Destiny Rocha admits to daytime somnolence and she admits to waking up still tired. Patient is at risk for obstructive sleep  apnea. Patent has a history of symptoms of daytime fatigue and morning fatigue. Patient generally gets 4 to 6 hours of sleep per night, and states they generally have restless sleep. Snoring is present. Apneic episodes are not present. Epworth Sleepiness Score is 10  Dyspnea on exertion Destiny Rocha notes increasing shortness of breath with certain activities (fast walking) and seems to be worsening over time with weight gain. She notes getting out of breath sooner with activity than she used to. This has not gotten worse recently. Destiny Rocha denies orthopnea.  OA (osteoarthritis) Destiny Rocha has a diagnosis of osteoarthritis and she is currently taking Lyrica and Norco.  Hyperlipidemia [name] has hyperlipidemia and she is not on medications. She is attempting to improve her cholesterol levels with intensive lifestyle modification including a low saturated fat diet, exercise and weight loss. She denies myalgias.  At risk for cardiovascular disease Virgilene is at a higher than average risk for cardiovascular disease due to obesity and hyperlipidemia.. She currently denies any chest pain.  Vitamin D deficiency Destiny Rocha has a diagnosis of vitamin D deficiency. She is currently taking vit D and denies nausea, vomiting or muscle weakness.  Snoring Agape admits to fatigue during the day. Epworth Sleepiness Score is 10.  B12 Deficiency Destiny Rocha has a diagnosis of B12 insufficiency and notes fatigue. She is taking OTC B12 supplement. This is not a new diagnosis. Destiny Rocha is not a vegetarian. She does not have a history of weight loss surgery.   Depression Screen Destiny Rocha's Food and Mood (modified PHQ-9) score was  Depression screen [facility] 2/9 05/07/2018  Decreased  Interest 1  Down, Depressed, Hopeless 1  PHQ - 2 Score 2  Altered sleeping 2  Tired, decreased energy 2  Change in appetite 2  Feeling bad or failure about yourself  0  Trouble concentrating 0  Moving slowly or fidgety/restless  1  Suicidal thoughts 0  PHQ-9 Score 9  Difficult doing work/chores Not difficult at all    ASSESSMENT AND PLAN:  Other fatigue - Plan: EKG 12-Lead, Comprehensive metabolic panel, Hemoglobin A1c, Insulin, random  Shortness of breath on exertion  Osteoarthritis of left knee, unspecified osteoarthritis type  Other hyperlipidemia - Plan: Lipid Panel With LDL/HDL Ratio  Vitamin D deficiency - Plan: VITAMIN D 25 Hydroxy (Vit-D Deficiency, Fractures)  Snoring - Plan: Ambulatory referral to Sleep Studies  B12 nutritional deficiency  Depression screening  At risk for heart disease  Class 3 severe obesity with serious comorbidity and body mass index (BMI) of 40.0 to 44.9 in adult, unspecified obesity type (HCC)  PLAN:  Fatigue [name] was informed that her fatigue may be related to obesity, depression or many other causes. Labs will be ordered, and in the meanwhile Destiny Rocha has agreed to work on diet, exercise and weight loss to help with fatigue. Proper sleep hygiene was discussed including the need for 7-8 hours of quality sleep each night. A sleep study was ordered based on symptoms and Epworth score.  Dyspnea on exertion Destiny Rocha's shortness of breath appears to be obesity related and exercise induced. She has agreed to work on weight loss and gradually increase exercise to treat her exercise induced shortness of breath. If Edward follows our instructions and loses weight without improvement of her shortness of breath, we will plan to refer to pulmonology. We will monitor this condition regularly. Destiny Rocha agrees to this plan.  OA (osteoarthritis) Destiny Rocha will do gentle stretches and slowly increase her exercise. Destiny Rocha will follow up with our clinic in 2 weeks.  Hyperlipidemia Destiny Rocha was informed of the American Heart Association Guidelines emphasizing intensive lifestyle modifications as the first line treatment for hyperlipidemia. We discussed many lifestyle  modifications today in depth, and Destiny Rocha will continue to work on decreasing saturated fats such as fatty red meat, butter and many fried foods. She will also increase vegetables and lean protein in her diet and continue to work on exercise and weight loss efforts. We will check lipids today and Nitara will follow up with our clinic in 2 weeks.  Cardiovascular risk counseling Emmalin was given extended (15 minutes) coronary artery disease prevention counseling today. She is 63 y.o. female and has risk factors for heart disease including obesity and hyperlipidemia. We discussed intensive lifestyle modifications today with an emphasis on specific weight loss instructions and strategies. Pt was also informed of the importance of increasing exercise and decreasing saturated fats to help prevent heart disease.  Vitamin D Deficiency Teigen was informed that low vitamin D levels contributes to fatigue and are associated with obesity, breast, and colon cancer. She will continue to take OTC vitamin D and will follow up for routine testing of vitamin D, at least 2-3 times per year. She was informed of the risk of over-replacement of vitamin D and agrees to not increase her dose unless she discusses this with [name] first. We will check vitamin D level today and she will follow up as directed.  Snoring We will schedule Destiny Rocha for a sleep study ([facility]). She will follow up with our clinic in 2 weeks.  B12 Deficiency Ragena will work on increasing  B12 rich foods in her diet. We will check B12 level today and she will follow up as directed.  Depression Screen Kandice had a mildly positive depression screening. Depression is commonly associated with obesity and often results in emotional eating behaviors. We will monitor this closely and work on CBT to help improve the non-hunger eating patterns. Referral to Psychology may be required if no improvement is seen as she continues  in our clinic.  Obesity Lourene is currently in the action stage of change and her goal is to continue with weight loss efforts. I recommend Julionna begin the structured treatment plan as follows:  She has agreed to follow the Category 3 plan Pho[place]ia has been instructed to eventually work up to a goal of 150 minutes of combined cardio and strengthening exercise per week for weight loss and overall health benefits. We discussed the following Behavioral Modification Strategies today: increase H2O intake, no skipping meals, keeping healthy foods in the home,better snacking choices, increasing lean protein intake, decreasing simple carbohydrates, increasing vegetables, decrease eating out, work on meal planning and easy cooking plans and ways to avoid boredom eating   She was informed of the importance of frequent follow up visits to maximize her success with intensive lifestyle modifications for her multiple health conditions. She was informed we would discuss her lab results at her next visit unless there is a critical issue that needs to be addressed sooner. Deneice agreed to keep her next visit at the agreed upon time to discuss these results.  ALLERGIES: Allergies  Allergen Reactions  . Milk-Related Compounds Other (See Comments)    bloating  . Morphine And Related Nausea And Vomiting    MEDICATIONS: Current Outpatient Medications on File Prior to Visit  Medication Sig Dispense Refill  . Biotin 5000 MCG TABS Take 5,000 mcg by mouth daily.    . cholecalciferol (VITAMIN D) 1000 UNITS tablet Take 1,000 Units by mouth every Monday, Wednesday, and Friday.    Marland Rocha L-Lysine 1000 MG TABS Take 1,000 mg by mouth daily.    . Melatonin 3 MG CAPS Take 3 mg by mouth at bedtime.    . Vitamin D, Ergocalciferol, (DRISDOL) 1.25 MG (50000 UT) CAPS capsule Take 50,000 Units by mouth every 7 (seven) days. 1 tab po q2-4 x wk    . apixaban (ELIQUIS) 2.5 MG TABS tablet Take 1 tablet (2.5 mg total) by mouth  2 (two) times daily. (Patient not taking: Reported on 05/07/2018) 60 tablet 0  . atorvastatin (LIPITOR) 20 MG tablet Take 20 mg by mouth daily at 6 PM.    . cyclobenzaprine (FLEXERIL) 10 MG tablet Take 1 tablet (10 mg total) by mouth 2 (two) times daily as needed for muscle spasms. (Patient not taking: Reported on 05/07/2018) 15 tablet 0  . docusate sodium (COLACE) 100 MG capsule Take 1 capsule (100 mg total) by mouth 2 (two) times daily. (Patient not taking: Reported on 05/07/2018) 60 capsule 3  . Glucosamine-Chondroitin 500-400 MG CAPS 1 tab by mouth TID (Patient not taking: Reported on 05/07/2018) 90 each 11  . HYDROcodone-acetaminophen (NORCO) 5-325 MG tablet Take 1-2 tablets by mouth every 4 (four) hours as needed for moderate pain. (Patient not taking: Reported on 05/07/2018) 90 tablet 0  . methocarbamol (ROBAXIN) 500 MG tablet Take 1 tablet (500 mg total) by mouth every 6 (six) hours as needed for muscle spasms. (Patient not taking: Reported on 05/07/2018) 20 tablet 0  . metoCLOPramide (REGLAN) 5 MG tablet Take 1-2 tablets (5-10 mg  total) by mouth every 8 (eight) hours as needed for nausea (if ondansetron (ZOFRAN) ineffective.). (Patient not taking: Reported on 05/07/2018) 30 tablet 0  . Multiple Vitamin (MULTIVITAMIN) tablet Take 1 tablet by mouth every other day.     . ondansetron (ZOFRAN) 4 MG tablet Take 1 tablet (4 mg total) by mouth every 6 (six) hours as needed for nausea. (Patient not taking: Reported on 05/07/2018) 20 tablet 0  . Phenylephrine-Guaifenesin 5-200 MG TABS Take 1 tablet by mouth 2 (two) times daily as needed (cold symptoms).    . predniSONE (DELTASONE) 10 MG tablet Take 2 tablets (20 mg total) by mouth daily. (Patient not taking: Reported on 05/07/2018) 10 tablet 0  . predniSONE (DELTASONE) 10 MG tablet Take 2 tablets (20 mg total) by mouth daily. (Patient not taking: Reported on 05/07/2018) 10 tablet 0  . pregabalin (LYRICA) 75 MG capsule Take 1 capsule (75 mg total) by mouth 2 (two)  times daily. (Patient not taking: Reported on 05/07/2018) 28 capsule 0  . senna (SENOKOT) 8.6 MG TABS tablet Take 2 tablets (17.2 mg total) by mouth at bedtime. (Patient not taking: Reported on 05/07/2018) 60 each 3  . UNABLE TO FIND Electrolyte stamina magnesium, potassium, vit b 6. concen trace 2-4 tabs per day     No current facility-administered medications on file prior to visit.     PAST MEDICAL HISTORY: Past Medical History:  Diagnosis Date  . Anxiety   . Arthritis   . Complication of anesthesia    slow to awaken  . Constipation   . Depression   . Dry eyes   . Eczema   . Elevated cholesterol   . Family history of breast cancer   . Family history of colon cancer   . GERD (gastroesophageal reflux disease)    occasional  . Headache    sinus  . Peripheral vascular disease ([place])   . Pneumonia as child  . PONV (postoperative nausea and vomiting)   . Pre-diabetes   . Shortness of breath   . Sleep disorder   . Swelling of both lower extremities   . Vitamin B12 deficiency   . Vitamin D deficiency     PAST SURGICAL HISTORY: Past Surgical History:  Procedure Laterality Date  . BREAST BIOPSY Left june 2016   normal, being checked every 6 months has breast marker  . CESAREAN SECTION     x 2  . KNEE SURGERY Bilateral    arthroscopy  . removal of fatty tissue on  back    . 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 Smoking status: Never Smoker  . Smokeless tobacco: Never Used  Substance Use Topics  . Alcohol use: Yes    Comment: occasional wine  . Drug use: No    FAMILY HISTORY: Family History  Problem Relation Age of Onset  . Colon cancer Mother        dx in her late 12s-60s  . Cancer Father        d. 58s from unknown cancer  . HIV/AIDS Brother        d. 61s  . Stroke  Maternal Uncle        d. 105  . Seizures Sister        d. 47s  . Stroke Brother   .  Breast cancer Daughter 87        Stage IV; ATM pos  . Cancer Maternal Uncle        unknonw d. 43s  . Breast cancer Cousin        dx. 32s  . Breast cancer Other        brother's daughter, dx under 61    ROS: Review of Systems  Constitutional: Positive for malaise/fatigue.  HENT: Positive for sinus pain.   Respiratory: Positive for shortness of breath (with activity).   Cardiovascular: Negative for chest pain and orthopnea.       + Leg Cramping + Very Cold Feet or Hands  Gastrointestinal: Positive for constipation. Negative for nausea and vomiting.  Musculoskeletal: Negative for myalgias.       + Muscle or Joint Pain  Skin:       + Dryness + Hair or Nail Changes  Endo/Heme/Allergies: Bruises/bleeds easily (bruising).       + Excessive Hunger  Psychiatric/Behavioral: The patient has insomnia.        + Stress    PHYSICAL EXAM: Blood pressure 127/84, pulse 71, temperature 98.3 F (36.8 C), temperature source Oral, height _0  (1.702 m), weight 278 lb (126.1 kg), SpO2 96 %. Body mass index is 43.54 kg/m. Physical Exam Vitals signs reviewed.  Constitutional:      Appearance: Normal appearance. She is well-developed. She is obese.  HENT:     Head: Normocephalic and atraumatic.     Nose: Nose normal.     Mouth/Throat:     Comments: Mallampati = 3 Eyes:     General: No scleral icterus.    Extraocular Movements: Extraocular movements intact.  Neck:     Musculoskeletal: Normal range of motion and neck supple.     Thyroid: No thyromegaly.  Cardiovascular:     Rate and Rhythm: Normal rate and regular rhythm.  Pulmonary:     Effort: Pulmonary effort is normal. No respiratory distress.  Abdominal:     Palpations: Abdomen is soft.     Tenderness: There is no abdominal tenderness.  Musculoskeletal: Normal range of motion.     Comments: Range of Motion normal in all 4 extremities  Skin:     General: Skin is warm and dry.  Neurological:     Mental Status: She is alert and oriented to person, place, and time.     Coordination: Coordination normal.  Psychiatric:        Mood and Affect: Mood normal.        Behavior: Behavior normal.     RECENT LABS AND TESTS: BMET    Component Value Date/Time   NA 140 03/05/2015 0445   K 4.3 03/05/2015 0445   CL 107 03/05/2015 0445   CO2 26 03/05/2015 0445   GLUCOSE 128 (H) 03/05/2015 0445   BUN 11 03/05/2015 0445   CREATININE 0.78 03/05/2015 0445   CALCIUM 8.8 (L) 03/05/2015 0445   GFRNONAA >60 03/05/2015 0445   GFRAA >60 03/05/2015 0445   No results found for: HGBA1C No results found for: INSULIN CBC    Component Value Date/Time   WBC 8.1 03/06/2015 0437   RBC 3.30 (L) 03/06/2015 0437   HGB 9.6 (L) 03/06/2015 0437   HCT 28.4 (L) 03/06/2015 0437   PLT 203 03/06/2015 0437   MCV 86.1 03/06/2015 0437   MCH 29.1 03/06/2015 0437   MCHC 33.8 03/06/2015 0437   RDW 14.1 03/06/2015 0437   LYMPHSABS 2.2 02/24/2015 0900   MONOABS 0.4  02/24/2015 0900   EOSABS 0.2 02/24/2015 0900   BASOSABS 0.0 02/24/2015 0900   Iron/TIBC/Ferritin/ %Sat No results found for: IRON, TIBC, FERRITIN, IRONPCTSAT Lipid Panel  No results found for: CHOL, TRIG, HDL, CHOLHDL, VLDL, LDLCALC, LDLDIRECT Hepatic Function Panel     Component Value Date/Time   PROT 7.5 02/24/2015 0900   ALBUMIN 4.1 02/24/2015 0900   AST 20 02/24/2015 0900   ALT 19 02/24/2015 0900   ALKPHOS 65 02/24/2015 0900   BILITOT 0.6 02/24/2015 0900   No results found for: TSH  ECG  shows NSR with a rate of 76 BPM INDIRECT CALORIMETER done today shows a VO2 of 281 and a REE of 1959.  Her calculated basal metabolic rate is 2561 thus her basal metabolic rate is better than expected.       OBESITY BEHAVIORAL INTERVENTION VISIT  Today's visit was # 1   Starting weight: 278 lbs Starting date: 05/07/2018 Today's weight : 278 lbs  Today's date: 05/07/2018 Total lbs lost to  date: 0 At least 15 minutes were spent on discussing the following behavioral intervention visit.   ASK: We discussed the diagnosis of obesity with Mickeal Skinner today and Hanifah agreed to give [name] permission to discuss obesity behavioral modification therapy today.  ASSESS: Jailyn has the diagnosis of obesity and her BMI today is 43.53 Aiysha is in the action stage of change   ADVISE: Nai was educated on the multiple health risks of obesity as well as the benefit of weight loss to improve her health. She was advised of the need for long term treatment and the importance of lifestyle modifications to improve her current health and to decrease her risk of future health problems.  AGREE: Multiple dietary modification options and treatment options were discussed and  Jyla agreed to follow the recommendations documented in the above note.  ARRANGE: Myana was educated on the importance of frequent visits to treat obesity as outlined per CMS and USPSTF guidelines and agreed to schedule her next follow up appointment today.  Corey Skains, am acting as Location manager for [employer]. Owens Shark, DO  I have reviewed the above documentation for accuracy and completeness, and I agree with the above. -Jearld Lesch, DO

## 2018-05-07 NOTE — Progress Notes (Signed)
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 female is here because of  Her ATM mutation. She was referred to genetic testing  by Dr. Orland Penman due to her daughter ATM mutation. Due to her ATM mutation positive results she was referred by the genetic counselor Santiago Glad to see me.   Today, she is here alone. She has a family history of multiple cancers. Her mother had colon cancer in her 5s-60's and her father died from an unknown cancer in his 34's.The pt has two daughters, one who developed cancer at 70 and has an ATM mutation. Her other daughter has not been tested for any mutation. She has five maternal half siblings. One of her brother's daughter had breat cancer under the age of 70. No one in her family has had ovarian or pancreatic cancer.   Has past medical history of acid reflux, arthritis, peripheral vascular disease. She is postmenopausal but does not use any hormone therapy. She does not smoke, she socially drinks alcohol. She had her first daughter when she was 59. Her last mammogram was around 9/19 and it came back normal.   Pertinent positives and negatives of review of systems are listed and detailed within the above HPI.  MEDICAL HISTORY:  Past Medical History:  Diagnosis Date  . Anxiety   . Arthritis   . Complication of anesthesia    slow to awaken  . Constipation   . Depression   . Dry eyes   . Eczema   . Elevated cholesterol   . Family history of breast cancer   . Family history of colon cancer   . GERD (gastroesophageal reflux disease)    occasional  . Headache    sinus  . Peripheral vascular disease ([place])   . Pneumonia as child  . PONV (postoperative nausea and vomiting)   . Pre-diabetes   . Shortness of breath   . Sleep disorder   . Swelling of both lower extremities   . Vitamin B12 deficiency   . Vitamin D deficiency     SURGICAL HISTORY: Past Surgical History:  Procedure Laterality Date  . BREAST BIOPSY Left june 2016   normal, being checked every 6 months has breast marker  . CESAREAN SECTION     x 2  . KNEE SURGERY Bilateral    arthroscopy  . removal of fatty tissue on  back    . TOTAL KNEE ARTHROPLASTY Left 03/04/2015   Procedure: LEFT TOTAL KNEE ARTHROPLASTY;  Surgeon: Rod Can, MD;  Location: [facility];  Service: Orthopedics;  Laterality: Left;    SOCIAL HISTORY: Social History   Socioeconomic History  . Marital status: Married    Spouse name: Mateo Flow  . Number of children: Not on file  . Years of education: Not on file  . Highest education level: Not on file  Occupational History  . Occupation: Occupational hygienist- on [employer]  Social Needs  . Financial resource strain: Not on file  . Food insecurity:    Worry: Not on file    Inability: Not on file  . Transportation needs:    Medical: Not on file  Non-medical: Not on file  Tobacco Use  . Smoking status: Never Smoker  . Smokeless tobacco: Never Used  Substance and Sexual Activity  . Alcohol use: Yes    Comment: occasional wine  . Drug use: No  . Sexual activity: Not on file  Lifestyle  . Physical activity:    Days per week: Not on file    Minutes per session: Not on file  . Stress: Not on file  Relationships  . Social connections:    Talks on phone: Not on file    Gets together: Not on file    Attends religious service: Not on file    Active member of club or organization: Not on file    Attends meetings of clubs or organizations: Not on file    Relationship status: Not on file  . Intimate partner violence:    Fear of current or ex partner: Not  on file    Emotionally abused: Not on file    Physically abused: Not on file    Forced sexual activity: Not on file  Other Topics Concern  . Not on file  Social History Narrative  . Not on file    FAMILY HISTORY: Family History  Problem Relation Age of Onset  . Colon cancer Mother        dx in her late 29s-60s  . Cancer Father        d. 64s from unknown cancer  . HIV/AIDS Brother        d. 91s  . Stroke Maternal Uncle        d. 2  . Seizures Sister        d. 61s  . Stroke Brother   . Breast cancer Daughter 28        Stage IV; ATM pos  . Cancer Maternal Uncle        unknonw d. 76s  . Breast cancer Cousin        dx. 20s    ALLERGIES:  is allergic to milk-related compounds and morphine and related.  MEDICATIONS:  Current Outpatient Medications  Medication Sig Dispense Refill  . Biotin 5000 MCG TABS Take 5,000 mcg by mouth daily.    . cholecalciferol (VITAMIN D) 1000 UNITS tablet Take 1,000 Units by mouth every Monday, Wednesday, and Friday.    Marland Kitchen L-Lysine 1000 MG TABS Take 1,000 mg by mouth daily.    . Melatonin 3 MG CAPS Take 3 mg by mouth at bedtime.    . Multiple Vitamin (MULTIVITAMIN) tablet Take 1 tablet by mouth every other day.      No current facility-administered medications for this visit.     REVIEW OF SYSTEMS:   Constitutional: Denies fevers, chills or abnormal night sweats Eyes: Denies blurriness of vision, double vision or watery eyes Ears, nose, mouth, throat, and face: Denies mucositis or sore throat Respiratory: Denies cough, dyspnea or wheezes Cardiovascular: Denies palpitation, chest discomfort or lower extremity swelling Gastrointestinal:  Denies nausea, heartburn or change in bowel habits Skin: Denies abnormal skin rashes Lymphatics: Denies new lymphadenopathy or easy bruising Neurological:Denies numbness, tingling or new weaknesses Behavioral/Psych: Mood is stable, no new changes  All other systems were reviewed with the patient and are  negative.  PHYSICAL EXAMINATION: ECOG PERFORMANCE STATUS: 0 - Asymptomatic  Vitals:   05/09/18 1436  BP: 139/77  Pulse: 63  Resp: 17  Temp: 98.3 F (36.8 C)  SpO2: 100%   Filed Weights   05/09/18 1436  Weight: 284 lb 1.6  oz (128.9 kg)    GENERAL:alert, no distress and comfortable SKIN: skin color, texture, turgor are normal, no rashes or significant lesions LYMPH:  no palpable lymphadenopathy in the cervical, axillary or inguinal LUNGS: clear to auscultation and percussion with normal breathing effort HEART: regular rate & rhythm and no murmurs and no lower extremity edema ABDOMEN:abdomen soft, non-tender and normal bowel sounds Musculoskeletal:no cyanosis of digits and no clubbing  PSYCH: alert & oriented x 3 with fluent speech NEURO: no focal motor/sensory deficits  LABORATORY DATA:  I have reviewed the data as listed CBC Latest Ref Rng & Units 03/06/2015 03/05/2015 02/24/2015  WBC 4.0 - 10.5 K/uL 8.1 10.1 5.6  Hemoglobin 12.0 - 15.0 g/dL 9.6(L) 11.6(L) 13.1  Hematocrit 36.0 - 46.0 % 28.4(L) 34.3(L) 39.6  Platelets 150 - 400 K/uL 203 221 285   CMP Latest Ref Rng & Units 05/07/2018 03/05/2015 02/24/2015  Glucose 65 - 99 mg/dL 95 128(H) 98  BUN 8 - 27 mg/dL '15 11 17  '$ Creatinine 0.57 - 1.00 mg/dL 0.86 0.78 0.74  Sodium 134 - 144 mmol/L 140 140 139  Potassium 3.5 - 5.2 mmol/L 4.3 4.3 4.1  Chloride 96 - 106 mmol/L 101 107 106  CO2 20 - 29 mmol/L '25 26 28  '$ Calcium 8.7 - 10.3 mg/dL 10.0 8.8(L) 9.1  Total Protein 6.0 - 8.5 g/dL 7.6 - 7.5  Total Bilirubin 0.0 - 1.2 mg/dL 0.6 - 0.6  Alkaline Phos 39 - 117 IU/L 72 - 65  AST 0 - 40 IU/L 14 - 20  ALT 0 - 32 IU/L 14 - 19   PATHOLOGY: 01/28/2018 GENETIC TESTING   RADIOGRAPHIC STUDIES: I have personally reviewed the radiological images as listed and agreed with the findings in the report. No results found.  ASSESSMENT & PLAN:  EMSLEE LOPEZMARTINEZ is a 63 y.o. female with history of  1. 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 The genetic testing January 28, 2018 through the [facility] offered by [facility] identified a single, heterozygous pathogenic gene mutation called ATM, [id number].  -We reviewed that the ATM gene is in the pathway of DNA repair.  The pathological mutation of the ATM mutation she has predicts high risk of breast cancer, in the range of 25 to 40% lifetime.  According to the NCCN guidelines, we recommend annual mammogram and breast MRI for screening, and I recommend 6 months apart for these two screening tests.  Prophylactic double mastectomy is not recommended due to insufficient data.  -We also discussed other risk factors for breast cancer, such as obesity, postmenopausal hormonal replacement, family history, etc.  I recommend her to have healthy diet, exercise, and avoid estrogen replacement, to reduce her risk of malignancy including breast cancer.  -We discussed chemoprevention with antiestrogen therapy to reduce risk of breast cancer.  However the data of antiestrogen therapy in genetic mutation related breast cancer is insufficient and I do not recommend.  -She is very compliant with cancer screening, she also agrees to see her primary care physician and gynecologist to have breast exam by her physicians twice a year.  -We also discussed slightly increased risk of ovarian cancer, pancreatic cancer, prostate cancer and colon cancer from ATM mutation, however the data is insufficient, and routine screening for those cancers are not recommended.  -I will see her as needed in the future    Plan  - I recommend annual mammogram and breast MRI for screening. I will  order her screening breast MRI w wo contrast today to be done in 2 months  -I encourage her to have healthy diet, exercise routinely, and avoid hormonal replacement therapy  - F/u open, she will follow-up with her primary care physician for routine breast exam, mammogram and  breast MRI in the future.  Orders Placed This Encounter  Procedures  . MR BREAST BILATERAL W [place] [facility]    ATM mutation carrrier, high risk for breast cancer    Standing Status:   Future    Standing Expiration Date:   07/08/2019    Order Specific Question:   If indicated for the ordered procedure, I authorize the administration of contrast media per Radiology protocol    Answer:   Yes    Order Specific Question:   What is the patient's sedation requirement?    Answer:   No Sedation    Order Specific Question:   Does the patient have a pacemaker or implanted devices?    Answer:   No    Order Specific Question:   Radiology Contrast Protocol - do NOT remove file path    Answer:   [email or web address]    Order Specific Question:   Preferred imaging location?    Answer:   [facility] (table limit-550lbs)    All questions were answered. The patient knows to call the clinic with any problems, questions or concerns. I spent 30 minutes counseling the patient face to face. The total time spent in the appointment was 40 minutes and more than 50% was on counseling.  I, Manson Allan am acting as scribe for Dr. Truitt Merle.  I have reviewed the above documentation for accuracy and completeness, and I agree with the above.      Truitt Merle, MD 05/09/2018

## 2018-05-08 DIAGNOSIS — Z6841 Body Mass Index (BMI) 40.0 and over, adult: Secondary | ICD-10-CM

## 2018-05-08 DIAGNOSIS — E7849 Other hyperlipidemia: Secondary | ICD-10-CM | POA: Insufficient documentation

## 2018-05-08 LAB — COMPREHENSIVE METABOLIC PANEL
ALT: 14 IU/L (ref 0–32)
AST: 14 IU/L (ref 0–40)
Albumin/Globulin Ratio: 1.5 (ref 1.2–2.2)
Albumin: 4.5 g/dL (ref 3.8–4.9)
Alkaline Phosphatase: 72 IU/L (ref 39–117)
BILIRUBIN TOTAL: 0.6 mg/dL (ref 0.0–1.2)
BUN/Creatinine Ratio: 17 (ref 12–28)
BUN: 15 mg/dL (ref 8–27)
CHLORIDE: 101 mmol/L (ref 96–106)
CO2: 25 mmol/L (ref 20–29)
CREATININE: 0.86 mg/dL (ref 0.57–1.00)
Calcium: 10 mg/dL (ref 8.7–10.3)
GFR calc Af Amer: 85 mL/min/{1.73_m2} (ref >59)
GFR calc non Af Amer: 74 mL/min/{1.73_m2} (ref >59)
GLUCOSE: 95 mg/dL (ref 65–99)
Globulin, Total: 3.1 g/dL (ref 1.5–4.5)
Potassium: 4.3 mmol/L (ref 3.5–5.2)
Sodium: 140 mmol/L (ref 134–144)
Total Protein: 7.6 g/dL (ref 6.0–8.5)

## 2018-05-08 LAB — VITAMIN B12: VITAMIN B 12: 874 pg/mL (ref 232–1245)

## 2018-05-08 LAB — LIPID PANEL WITH LDL/HDL RATIO
CHOLESTEROL TOTAL: 219 mg/dL — AB (ref 100–199)
HDL: 61 mg/dL (ref >39)
LDL Calculated: 142 mg/dL — ABNORMAL HIGH (ref 0–99)
LDl/HDL Ratio: 2.3 ratio (ref 0.0–3.2)
TRIGLYCERIDES: 78 mg/dL (ref 0–149)
VLDL Cholesterol Cal: 16 mg/dL (ref 5–40)

## 2018-05-08 LAB — HEMOGLOBIN A1C
Est. average glucose Bld gHb Est-mCnc: 123 mg/dL
HEMOGLOBIN A1C: 5.9 % — AB (ref 4.8–5.6)

## 2018-05-08 LAB — INSULIN, RANDOM: INSULIN: 4.9 u[IU]/mL (ref 2.6–24.9)

## 2018-05-08 LAB — VITAMIN D 25 HYDROXY (VIT D DEFICIENCY, FRACTURES): Vit D, 25-Hydroxy: 34.2 ng/mL (ref 30.0–100.0)

## 2018-05-09 ENCOUNTER — Encounter: Payer: Self-pay | Admitting: Hematology

## 2018-05-09 ENCOUNTER — Inpatient Hospital Stay: Payer: 59 | Attending: Hematology | Admitting: Hematology

## 2018-05-09 VITALS — BP 139/77 | HR 63 | Temp 98.3°F | Resp 17 | Ht 67.0 in | Wt 284.1 lb

## 2018-05-09 DIAGNOSIS — Z1501 Genetic susceptibility to malignant neoplasm of breast: Secondary | ICD-10-CM | POA: Insufficient documentation

## 2018-05-09 DIAGNOSIS — Z1509 Genetic susceptibility to other malignant neoplasm: Secondary | ICD-10-CM

## 2018-05-09 DIAGNOSIS — Z800 Family history of malignant neoplasm of digestive organs: Secondary | ICD-10-CM | POA: Diagnosis not present

## 2018-05-09 DIAGNOSIS — Z803 Family history of malignant neoplasm of breast: Secondary | ICD-10-CM | POA: Insufficient documentation

## 2018-05-09 DIAGNOSIS — Z1589 Genetic susceptibility to other disease: Secondary | ICD-10-CM | POA: Diagnosis not present

## 2018-05-09 DIAGNOSIS — Z1379 Encounter for other screening for genetic and chromosomal anomalies: Secondary | ICD-10-CM | POA: Diagnosis not present

## 2018-05-10 ENCOUNTER — Telehealth: Payer: Self-pay | Admitting: Hematology

## 2018-05-10 NOTE — Telephone Encounter (Signed)
No los per 2/20.

## 2018-05-11 ENCOUNTER — Encounter: Payer: Self-pay | Admitting: Hematology

## 2018-05-13 ENCOUNTER — Ambulatory Visit (INDEPENDENT_AMBULATORY_CARE_PROVIDER_SITE_OTHER): Payer: Self-pay | Admitting: Bariatrics

## 2018-05-21 ENCOUNTER — Encounter (INDEPENDENT_AMBULATORY_CARE_PROVIDER_SITE_OTHER): Payer: Self-pay | Admitting: Bariatrics

## 2018-05-21 ENCOUNTER — Ambulatory Visit (INDEPENDENT_AMBULATORY_CARE_PROVIDER_SITE_OTHER): Payer: 59 | Admitting: Bariatrics

## 2018-05-21 VITALS — BP 116/73 | HR 66 | Temp 97.8°F | Ht 67.0 in | Wt 276.0 lb

## 2018-05-21 DIAGNOSIS — E559 Vitamin D deficiency, unspecified: Secondary | ICD-10-CM | POA: Diagnosis not present

## 2018-05-21 DIAGNOSIS — K5909 Other constipation: Secondary | ICD-10-CM | POA: Diagnosis not present

## 2018-05-21 DIAGNOSIS — E7800 Pure hypercholesterolemia, unspecified: Secondary | ICD-10-CM

## 2018-05-21 DIAGNOSIS — Z6841 Body Mass Index (BMI) 40.0 and over, adult: Secondary | ICD-10-CM

## 2018-05-21 DIAGNOSIS — R7303 Prediabetes: Secondary | ICD-10-CM | POA: Diagnosis not present

## 2018-05-21 DIAGNOSIS — Z9189 Other specified personal risk factors, not elsewhere classified: Secondary | ICD-10-CM | POA: Diagnosis not present

## 2018-05-21 MED ORDER — VITAMIN D (ERGOCALCIFEROL) 1.25 MG (50000 UNIT) PO CAPS: 50000.0000 [IU] | ORAL_CAPSULE | ORAL | 0 refills | Status: DC

## 2018-05-21 NOTE — Progress Notes (Signed)
Office: [phone]  /  Fax: [phone]   HPI:   Chief Complaint: OBESITY Destiny Rocha is here to discuss her progress with her obesity treatment plan. She is on the Category 2 plan and is following her eating plan approximately 70-75% of the time. She states she is exercising 0 minutes 0 times per week. Destiny Rocha is doing well. The plan did decrease her craving for sweets. She reports she did not go to the grocery store and did not have all of the food. She did not like the milk. Her weight is 276 lb (125.2 kg) today and has had a weight loss of 2 pounds over a period of 2 weeks since her last visit. She has lost 2 lbs since starting treatment with [name].  Pre-Diabetes Destiny Rocha has a diagnosis of prediabetes based on her elevated Hgb A1c and was informed this puts her at greater risk of developing diabetes. Her A1c was reported at 5.9 and insulin at 4.9 on 05/07/2018. She continues to work on diet and exercise to decrease risk of diabetes. She denies polyphagia.  Elevated Cholesterol Destiny Rocha last LDL was reported at 142 and her total cholesterol at 219 on 05/07/2018.  Vitamin D deficiency Destiny Rocha has a diagnosis of Vitamin D deficiency with a Vitamin D level of 34.2 on 05/07/2018. She is currently taking prescription Vit D and denies nausea, vomiting or muscle weakness.  At risk for osteopenia and osteoporosis Destiny Rocha is at higher risk of osteopenia and osteoporosis due to Vitamin D deficiency.   Constipation Destiny Rocha notes constipation with no associated nausea or vomiting. She reports drinking adequateH20 recently.  ASSESSMENT AND PLAN:  Vitamin D deficiency - Plan: Vitamin D, Ergocalciferol, (DRISDOL) 1.25 MG (50000 UT) CAPS capsule  Prediabetes  Elevated cholesterol  Other constipation  At risk for osteoporosis  Class 3 severe obesity with serious comorbidity and body mass index (BMI) of 40.0 to 44.9 in adult, unspecified obesity type  [facility])  PLAN:  Pre-Diabetes Destiny Rocha will continue to work on weight loss, exercise, and decreasing simple carbohydrates in her diet to help decrease the risk of diabetes. We dicussed metformin including benefits and risks. She was informed that eating too many simple carbohydrates or too many calories at one sitting increases the likelihood of GI side effects. Destiny Rocha was advised to decrease her carbohydrates and to increase her protein.  Elevated Cholesterol Destiny Rocha was advised to decrease her saturated fat and add MUFAS and PUFAS.  Vitamin D Deficiency Destiny Rocha was informed that low Vitamin D levels contributes to fatigue and are associated with obesity, breast, and colon cancer. She agrees to continue to take prescription Vit D @ 50,000 IU every week #4 with 0 refills and will follow-up for routine testing of Vitamin D, at least 2-3 times per year. She was informed of the risk of over-replacement of Vitamin D and agrees to not increase her dose unless she discusses this with [name] first. Destiny Rocha agrees to follow-up with our clinic in 2 weeks.  At risk for osteopenia and osteoporosis Destiny Rocha is at higher risk of osteopenia and osteoporosis due to Vitamin D deficiency.   Constipation Destiny Rocha was informed decrease bowel movement frequency is normal while losing weight, but stools should not be hard or painful. She was advised to increase her H20 intake and take MiraLax daily or 3 times per week. She will work on increasing her fiber intake. High fiber foods were discussed today.  Obesity Destiny Rocha is currently in the action stage of change. As such, her goal is  to continue with weight loss efforts. She has agreed to follow the Category 2 plan. Destiny Rocha has been instructed to work up to a goal of 150 minutes of combined cardio and strengthening exercise per week for weight loss and overall health benefits. We discussed the following Behavioral Modification Strategies today:  increasing lean protein intake, decreasing simple carbohydrates, increasing vegetables, increase H20 intake, decrease eating out, no skipping meals, work on meal planning and easy cooking plans, and keeping healthy foods in the home.  Destiny Rocha has agreed to follow-up with our clinic in 2 weeks. She was informed of the importance of frequent follow-up visits to maximize her success with intensive lifestyle modifications for her multiple health conditions.  ALLERGIES: Allergies  Allergen Reactions  . Milk-Related Compounds Other (See Comments)    bloating  . Morphine And Related Nausea And Vomiting    MEDICATIONS: Current Outpatient Medications on File Prior to Visit  Medication Sig Dispense Refill  . Biotin 5000 MCG TABS Take 5,000 mcg by mouth daily.    . cholecalciferol (VITAMIN D) 1000 UNITS tablet Take 1,000 Units by mouth every Monday, Wednesday, and Friday.    Marland Kitchen L-Lysine 1000 MG TABS Take 1,000 mg by mouth daily.    . Melatonin 3 MG CAPS Take 3 mg by mouth at bedtime.    . Multiple Vitamin (MULTIVITAMIN) tablet Take 1 tablet by mouth every other day.      No current facility-administered medications on file prior to visit.     PAST MEDICAL HISTORY: Past Medical History:  Diagnosis Date  . Anxiety   . Arthritis   . Complication of anesthesia    slow to awaken  . Constipation   . Depression   . Dry eyes   . Eczema   . Elevated cholesterol   . Family history of breast cancer   . Family history of colon cancer   . GERD (gastroesophageal reflux disease)    occasional  . Headache    sinus  . Peripheral vascular disease ([place])   . Pneumonia as child  . PONV (postoperative nausea and vomiting)   . Pre-diabetes   . Shortness of breath   . Sleep disorder   . Swelling of both lower extremities   . Vitamin B12 deficiency   . Vitamin D deficiency     PAST SURGICAL HISTORY: Past Surgical History:  Procedure Laterality Date  . BREAST BIOPSY Left june 2016   normal,  being checked every 6 months has breast marker  . CESAREAN SECTION     x 2  . KNEE SURGERY Bilateral    arthroscopy  . removal of fatty tissue on  back    . 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 Smoking status: Never Smoker  . Smokeless tobacco: Never Used  Substance Use Topics  . Alcohol use: Yes    Comment: occasional wine  . Drug use: No    FAMILY HISTORY: Family History  Problem Relation Age of Onset  . Colon cancer Mother        dx in her late 41s-60s  . Cancer Father        d. 71s from unknown cancer  . HIV/AIDS Brother        d. 65s  . Stroke Maternal Uncle  d. 71  . Seizures Sister        d. 57s  . Stroke Brother   . Breast cancer Daughter 46        Stage IV; ATM pos  . Cancer Maternal Uncle        unknonw d. 31s  . Breast cancer Cousin        dx. 40s   ROS: Review of Systems  Constitutional: Positive for weight loss.  Gastrointestinal: Positive for constipation. Negative for nausea and vomiting.  Musculoskeletal:       Negative for muscle weakness.  Endo/Heme/Allergies:       Negative for polyphagia. Negative for hypoglycemia.   PHYSICAL EXAM: Blood pressure 116/73, pulse 66, temperature 97.8 F (36.6 C), temperature source Oral, height '5\' 7"'$  (1.702 m), weight 276 lb (125.2 kg), SpO2 99 %. Body mass index is 43.23 kg/m. Physical Exam Vitals signs reviewed.  Constitutional:      Appearance: Normal appearance. She is obese.  Cardiovascular:     Rate and Rhythm: Normal rate.     Pulses: Normal pulses.  Pulmonary:     Effort: Pulmonary effort is normal.     Breath sounds: Normal breath sounds.  Musculoskeletal: Normal range of motion.  Skin:    General: Skin is warm and dry.  Neurological:     Mental Status: She is alert and oriented to person, place,  and time.  Psychiatric:        Behavior: Behavior normal.   RECENT LABS AND TESTS: BMET    Component Value Date/Time   NA 140 05/07/2018 0943   K 4.3 05/07/2018 0943   CL 101 05/07/2018 0943   CO2 25 05/07/2018 0943   GLUCOSE 95 05/07/2018 0943   GLUCOSE 128 (H) 03/05/2015 0445   BUN 15 05/07/2018 0943   CREATININE 0.86 05/07/2018 0943   CALCIUM 10.0 05/07/2018 0943   GFRNONAA 74 05/07/2018 0943   GFRAA 85 05/07/2018 0943   Lab Results  Component Value Date   HGBA1C 5.9 (H) 05/07/2018   Lab Results  Component Value Date   INSULIN 4.9 05/07/2018   CBC    Component Value Date/Time   WBC 8.1 03/06/2015 0437   RBC 3.30 (L) 03/06/2015 0437   HGB 9.6 (L) 03/06/2015 0437   HCT 28.4 (L) 03/06/2015 0437   PLT 203 03/06/2015 0437   MCV 86.1 03/06/2015 0437   MCH 29.1 03/06/2015 0437   MCHC 33.8 03/06/2015 0437   RDW 14.1 03/06/2015 0437   LYMPHSABS 2.2 02/24/2015 0900   MONOABS 0.4 02/24/2015 0900   EOSABS 0.2 02/24/2015 0900   BASOSABS 0.0 02/24/2015 0900   Iron/TIBC/Ferritin/ %Sat No results found for: IRON, TIBC, FERRITIN, IRONPCTSAT Lipid Panel     Component Value Date/Time   CHOL 219 (H) 05/07/2018 0943   TRIG 78 05/07/2018 0943   HDL 61 05/07/2018 0943   LDLCALC 142 (H) 05/07/2018 0943   Hepatic Function Panel     Component Value Date/Time   PROT 7.6 05/07/2018 0943   ALBUMIN 4.5 05/07/2018 0943   AST 14 05/07/2018 0943   ALT 14 05/07/2018 0943   ALKPHOS 72 05/07/2018 0943   BILITOT 0.6 05/07/2018 0943   No results found for: TSH    Ref. Range 05/07/2018 09:43  Vitamin D, 25-Hydroxy Latest Ref Range: 30.0 - 100.0 ng/mL 34.2   OBESITY BEHAVIORAL INTERVENTION VISIT  Today's visit was #2  Starting weight: 278 lbs Starting date: 05/07/2018 Today's weight: 276 lbs  Today's date: 05/21/2018 Total  lbs lost to date: 2    05/21/2018  Height '5\' 7"'$  (1.702 m)  Weight 276 lb (125.2 kg)  BMI (Calculated) 43.22  BLOOD PRESSURE - SYSTOLIC 812  BLOOD  PRESSURE - DIASTOLIC 73   Body Fat % 75.1 %  Total Body Water (lbs) 101.8 lbs   ASK: We discussed the diagnosis of obesity with Mickeal Skinner today and [facility] agreed to give [name] permission to discuss obesity behavioral modification therapy today.  ASSESS: Juanna has the diagnosis of obesity and her BMI today is 43.22. Marbeth is in the action stage of change.   ADVISE: Deliliah was educated on the multiple health risks of obesity as well as the benefit of weight loss to improve her health. She was advised of the need for long term treatment and the importance of lifestyle modifications to improve her current health and to decrease her risk of future health problems.  AGREE: Multiple dietary modification options and treatment options were discussed and  Anija agreed to follow the recommendations documented in the above note.  ARRANGE: Johan was educated on the importance of frequent visits to treat obesity as outlined per CMS and USPSTF guidelines and agreed to schedule her next follow up appointment today.  Migdalia Dk, am acting as Location manager for [employer], DO  I have reviewed the above documentation for accuracy and completeness, and I agree with the above. -Jearld Lesch, DO

## 2018-05-28 ENCOUNTER — Ambulatory Visit (INDEPENDENT_AMBULATORY_CARE_PROVIDER_SITE_OTHER): Payer: Self-pay | Admitting: Bariatrics

## 2018-06-06 ENCOUNTER — Ambulatory Visit (INDEPENDENT_AMBULATORY_CARE_PROVIDER_SITE_OTHER): Payer: 59 | Admitting: Bariatrics

## 2018-06-20 ENCOUNTER — Other Ambulatory Visit (HOSPITAL_COMMUNITY)
Admission: RE | Admit: 2018-06-20 | Discharge: 2018-06-20 | Disposition: A | Discharge status: Home / Self Care | Payer: 59 | Source: Ambulatory Visit | Attending: Nurse Practitioner | Admitting: Nurse Practitioner

## 2018-06-20 DIAGNOSIS — N898 Other specified noninflammatory disorders of vagina: Secondary | ICD-10-CM | POA: Diagnosis not present

## 2018-06-20 DIAGNOSIS — R87619 Unspecified abnormal cytological findings in specimens from cervix uteri: Secondary | ICD-10-CM | POA: Diagnosis not present

## 2018-06-20 DIAGNOSIS — R21 Rash and other nonspecific skin eruption: Secondary | ICD-10-CM | POA: Diagnosis not present

## 2018-06-20 DIAGNOSIS — Z01419 Encounter for gynecological examination (general) (routine) without abnormal findings: Secondary | ICD-10-CM | POA: Diagnosis not present

## 2018-06-20 DIAGNOSIS — Z124 Encounter for screening for malignant neoplasm of cervix: Secondary | ICD-10-CM | POA: Diagnosis not present

## 2018-06-21 ENCOUNTER — Other Ambulatory Visit: Payer: Self-pay | Admitting: Nurse Practitioner

## 2018-06-21 LAB — CYTOLOGY - PAP
Diagnosis: NEGATIVE
HPV: NOT DETECTED

## 2018-07-17 DIAGNOSIS — G4733 Obstructive sleep apnea (adult) (pediatric): Secondary | ICD-10-CM | POA: Diagnosis not present

## 2018-07-18 DIAGNOSIS — G4733 Obstructive sleep apnea (adult) (pediatric): Secondary | ICD-10-CM | POA: Diagnosis not present

## 2018-07-22 ENCOUNTER — Other Ambulatory Visit: Payer: Self-pay

## 2018-07-22 ENCOUNTER — Ambulatory Visit (INDEPENDENT_AMBULATORY_CARE_PROVIDER_SITE_OTHER): Payer: 59 | Admitting: Physician Assistant

## 2018-07-22 ENCOUNTER — Encounter (INDEPENDENT_AMBULATORY_CARE_PROVIDER_SITE_OTHER): Payer: Self-pay | Admitting: Physician Assistant

## 2018-07-23 ENCOUNTER — Other Ambulatory Visit: Payer: Self-pay

## 2018-07-23 ENCOUNTER — Ambulatory Visit (INDEPENDENT_AMBULATORY_CARE_PROVIDER_SITE_OTHER): Payer: 59 | Admitting: Physician Assistant

## 2018-07-23 DIAGNOSIS — E559 Vitamin D deficiency, unspecified: Secondary | ICD-10-CM

## 2018-07-23 DIAGNOSIS — Z6841 Body Mass Index (BMI) 40.0 and over, adult: Secondary | ICD-10-CM

## 2018-07-23 DIAGNOSIS — G4733 Obstructive sleep apnea (adult) (pediatric): Secondary | ICD-10-CM

## 2018-07-23 MED ORDER — VITAMIN D (ERGOCALCIFEROL) 1.25 MG (50000 UNIT) PO CAPS: 50000.0000 [IU] | ORAL_CAPSULE | ORAL | 0 refills | Status: DC

## 2018-07-24 NOTE — Progress Notes (Signed)
Office: [phone]  /  Fax: [phone] TeleHealth Visit:  Destiny Rocha has verbally consented to this TeleHealth visit today. The patient is located at home, the provider is located at the [facility] office. The participants in this visit include the listed provider and patient. The visit was conducted today via Webex.  HPI:   Chief Complaint: OBESITY Destiny Rocha is here to discuss her progress with her obesity treatment plan. She is on the Category 2 plan and is following her eating plan approximately 45% of the time. She states she is walking 30 minutes 5-7 times per week. [name] reports that she struggles to eat breakfast in the morning due to lack of hunger. She is also having trouble getting all of her protein in at lunch and dinner. We were unable to weigh the patient today for this TeleHealth visit. She states her weight is 274 lbs. She has lost 2 lbs since starting treatment with [name].  Vitamin D deficiency Destiny Rocha has a diagnosis of Vitamin D deficiency. She is currently taking prescription Vit D and denies nausea, vomiting or muscle weakness.  Obstructive Sleep Apnea Destiny Rocha just had a sleep study and was diagnosed with moderate sleep apnea. She will start using a CPAP machine.  ASSESSMENT AND PLAN:  Vitamin D deficiency - Plan: Vitamin D, Ergocalciferol, (DRISDOL) 1.25 MG (50000 UT) CAPS capsule  OSA (obstructive sleep apnea)  Class 3 severe obesity with serious comorbidity and body mass index (BMI) of 40.0 to 44.9 in adult, unspecified obesity type (Destiny Rocha)  PLAN:  Vitamin D Deficiency Destiny Rocha was informed that low Vitamin D levels contributes to fatigue and are associated with obesity, breast, and colon cancer. She agrees to continue to take prescription Vit D @ 50,000 IU every week #4 with 0 refills and will follow-up for routine testing of Vitamin D, at least 2-3 times per year. She was informed of the risk of over-replacement of Vitamin D  and agrees to not increase her dose unless she discusses this with [name] first. Destiny Rocha agrees to follow-up with our clinic in 2 weeks.  Obstructive Sleep Apnea Destiny Rocha will continue weight loss.  Obesity Destiny Rocha is currently in the action stage of change. As such, her goal is to continue with weight loss efforts. She has agreed to follow the Category 2 plan. Destiny Rocha has been instructed to work up to a goal of 150 minutes of combined cardio and strengthening exercise per week for weight loss and overall health benefits. We discussed the following Behavioral Modification Strategies today: increasing lean protein intake and work on meal planning and easy cooking plans.  Destiny Rocha has agreed to follow-up with our clinic in 2 weeks. She was informed of the importance of frequent follow-up visits to maximize her success with intensive lifestyle modifications for her multiple health conditions.  ALLERGIES: Allergies  Allergen Reactions  . Milk-Related Compounds Other (See Comments)    bloating  . Morphine And Related Nausea And Vomiting    MEDICATIONS: Current Outpatient Medications on File Prior to Visit  Medication Sig Dispense Refill  . Biotin 5000 MCG TABS Take 5,000 mcg by mouth daily.    . cholecalciferol (VITAMIN D) 1000 UNITS tablet Take 1,000 Units by mouth every Monday, Wednesday, and Friday.    Marland Kitchen L-Lysine 1000 MG TABS Take 1,000 mg by mouth daily.    . Melatonin 3 MG CAPS Take 3 mg by mouth at bedtime.    . Multiple Vitamin (MULTIVITAMIN) tablet Take 1 tablet by mouth every other  day.      No current facility-administered medications on file prior to visit.     PAST MEDICAL HISTORY: Past Medical History:  Diagnosis Date  . Anxiety   . Arthritis   . Complication of anesthesia    slow to awaken  . Constipation   . Depression   . Dry eyes   . Eczema   . Elevated cholesterol   . Family history of breast cancer   . Family history of colon cancer   . GERD  (gastroesophageal reflux disease)    occasional  . Headache    sinus  . Peripheral vascular disease ([place])   . Pneumonia as child  . PONV (postoperative nausea and vomiting)   . Pre-diabetes   . Shortness of breath   . Sleep disorder   . Swelling of both lower extremities   . Vitamin B12 deficiency   . Vitamin D deficiency     PAST SURGICAL HISTORY: Past Surgical History:  Procedure Laterality Date  . BREAST BIOPSY Left june 2016   normal, being checked every 6 months has breast marker  . CESAREAN SECTION     x 2  . KNEE SURGERY Bilateral    arthroscopy  . removal of fatty tissue on  back    . 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 Smoking status: Never Smoker  . Smokeless tobacco: Never Used  Substance Use Topics  . Alcohol use: Yes    Comment: occasional wine  . Drug use: No    FAMILY HISTORY: Family History  Problem Relation Age of Onset  . Colon cancer Mother        dx in her late 43s-60s  . Cancer Father        d. 38s from unknown cancer  . HIV/AIDS Brother        d. 58s  . Stroke Maternal Uncle        d. 2  . Seizures Sister        d. 72s  . Stroke Brother   . Breast cancer Daughter 46        Stage IV; ATM pos  . Cancer Maternal Uncle        unknonw d. 16s  . Breast cancer Cousin        dx. 40s   ROS: Review of Systems  Gastrointestinal: Negative for nausea and vomiting.  Musculoskeletal:       Negative for muscle weakness.   PHYSICAL EXAM: Pt in no acute distress  RECENT LABS AND TESTS: BMET    Component Value Date/Time   NA 140 05/07/2018 0943   K 4.3 05/07/2018 0943   CL 101 05/07/2018 0943   CO2 25 05/07/2018 0943   GLUCOSE 95 05/07/2018 0943   GLUCOSE 128 (H) 03/05/2015 0445   BUN 15 05/07/2018 0943   CREATININE 0.86 05/07/2018 0943    CALCIUM 10.0 05/07/2018 0943   GFRNONAA 74 05/07/2018 0943   GFRAA 85 05/07/2018 0943   Lab Results  Component Value Date   HGBA1C 5.9 (H) 05/07/2018   Lab Results  Component Value Date   INSULIN 4.9 05/07/2018   CBC    Component Value Date/Time   WBC 8.1 03/06/2015 0437   RBC 3.30 (L) 03/06/2015 0437   HGB 9.6 (L) 03/06/2015 7622  HCT 28.4 (L) 03/06/2015 0437   PLT 203 03/06/2015 0437   MCV 86.1 03/06/2015 0437   MCH 29.1 03/06/2015 0437   MCHC 33.8 03/06/2015 0437   RDW 14.1 03/06/2015 0437   LYMPHSABS 2.2 02/24/2015 0900   MONOABS 0.4 02/24/2015 0900   EOSABS 0.2 02/24/2015 0900   BASOSABS 0.0 02/24/2015 0900   Iron/TIBC/Ferritin/ %Sat No results found for: IRON, TIBC, FERRITIN, IRONPCTSAT Lipid Panel     Component Value Date/Time   CHOL 219 (H) 05/07/2018 0943   TRIG 78 05/07/2018 0943   HDL 61 05/07/2018 0943   LDLCALC 142 (H) 05/07/2018 0943   Hepatic Function Panel     Component Value Date/Time   PROT 7.6 05/07/2018 0943   ALBUMIN 4.5 05/07/2018 0943   AST 14 05/07/2018 0943   ALT 14 05/07/2018 0943   ALKPHOS 72 05/07/2018 0943   BILITOT 0.6 05/07/2018 0943   No results found for: TSH   Results for SEINI, LANNOM (MRN [id number]) as of 07/24/2018 11:25  Ref. Range 05/07/2018 09:43  Vitamin D, 25-Hydroxy Latest Ref Range: 30.0 - 100.0 ng/mL 34.2   I, Michaelene Song, am acting as Location manager for [employer], PA-C I, Abby Potash, PA-C have reviewed above note and agree with its content

## 2018-08-07 ENCOUNTER — Encounter (INDEPENDENT_AMBULATORY_CARE_PROVIDER_SITE_OTHER): Payer: Self-pay | Admitting: Bariatrics

## 2018-08-07 ENCOUNTER — Ambulatory Visit (INDEPENDENT_AMBULATORY_CARE_PROVIDER_SITE_OTHER): Payer: 59 | Admitting: Bariatrics

## 2018-08-07 ENCOUNTER — Other Ambulatory Visit: Payer: Self-pay

## 2018-08-07 DIAGNOSIS — R7303 Prediabetes: Secondary | ICD-10-CM

## 2018-08-07 DIAGNOSIS — E559 Vitamin D deficiency, unspecified: Secondary | ICD-10-CM | POA: Diagnosis not present

## 2018-08-07 DIAGNOSIS — Z6841 Body Mass Index (BMI) 40.0 and over, adult: Secondary | ICD-10-CM

## 2018-08-07 DIAGNOSIS — F3289 Other specified depressive episodes: Secondary | ICD-10-CM

## 2018-08-07 MED ORDER — METFORMIN HCL 500 MG PO TABS: 500.0000 mg | ORAL_TABLET | Freq: Every day | ORAL | 0 refills | Status: DC

## 2018-08-07 NOTE — Progress Notes (Signed)
Office: [phone]  /  Fax: [phone] TeleHealth Visit:  Destiny Rocha has verbally consented to this TeleHealth visit today. The patient is located at home, the provider is located at the [facility] office. The participants in this visit include the listed provider and patient and any and all parties involved. The visit was conducted today via WebEx.  HPI:   Chief Complaint: OBESITY Destiny Rocha is here to discuss her progress with her obesity treatment plan. She is on the Category 2 plan and is following her eating plan approximately 20 % of the time. She states she is walking 30 to 45 minutes 4 times per week. [name] states that she has gained about 8 pounds (weight 289 lbs). I last saw this patient on 05/31/18, but her last visit was with Abby Potash, PA-C. [name] does do some stress eating. We were unable to weigh the patient today for this TeleHealth visit. She feels as if she has gained weight since her last visit. She has gained 11 lbs since starting treatment with [name].  Vitamin D deficiency Destiny Rocha has a diagnosis of vitamin D deficiency. She is currently taking vit D and denies nausea, vomiting or muscle weakness.  Pre-Diabetes Destiny Rocha has a diagnosis of prediabetes based on her elevated Hgb A1c and was informed this puts her at greater risk of developing diabetes. She is not taking medications currently and continues to work on diet and exercise to decrease risk of diabetes. She denies nausea or hypoglycemia.  Depression with emotional eating behaviors Destiny Rocha is struggling with emotional eating and using food for comfort to the extent that it is negatively impacting her health. She often snacks when she is not hungry. Destiny Rocha sometimes feels she is out of control and then feels guilty that she made poor food choices. She has been working on behavior modification techniques to help reduce her emotional eating and has been somewhat successful.  She shows no sign of suicidal or homicidal ideations.  Depression screen PHQ 2/9 05/07/2018  Decreased Interest 1  Down, Depressed, Hopeless 1  PHQ - 2 Score 2  Altered sleeping 2  Tired, decreased energy 2  Change in appetite 2  Feeling bad or failure about yourself  0  Trouble concentrating 0  Moving slowly or fidgety/restless 1  Suicidal thoughts 0  PHQ-9 Score 9  Difficult doing work/chores Not difficult at all    ASSESSMENT AND PLAN:  Vitamin D deficiency  Prediabetes - Plan: metFORMIN (GLUCOPHAGE) 500 MG tablet  Other depression - with emotional eating  Class 3 severe obesity with serious comorbidity and body mass index (BMI) of 40.0 to 44.9 in adult, unspecified obesity type ([place])  PLAN:  Vitamin D Deficiency Destiny Rocha was informed that low vitamin D levels contributes to fatigue and are associated with obesity, breast, and colon cancer. She agrees to continue to take prescription Vit D _0 ,000 IU every week and will follow up for routine testing of vitamin D, at least 2-3 times per year. She was informed of the risk of over-replacement of vitamin D and agrees to not increase her dose unless she discusses this with [name] first.  Pre-Diabetes Destiny Rocha will continue to work on weight loss, exercise, increasing lean protein and decreasing simple carbohydrates in her diet to help decrease the risk of diabetes. We dicussed metformin including benefits and risks. She was informed that eating too many simple carbohydrates or too many calories at one sitting increases the likelihood of GI side effects. Destiny Rocha agreed to start  metformin 500 mg once daily in the morning with breakfast #30 with no refills and follow up with [name] as directed to monitor her progress.  Depression with Emotional Eating Behaviors We discussed behavior modification techniques today to help [name] deal with her emotional eating and depression. Referral was made to Dr. Mallie Mussel previously (needs to see Dr.  Mallie Mussel). We will check on referral, to see Dr. Mallie Mussel.  Obesity Destiny Rocha is currently in the action stage of change. As such, her goal is to continue with weight loss efforts She has agreed to follow the Category 2 plan [name] will continue exercise (walking the dog) for weight loss and overall health benefits. We discussed the following Behavioral Modification Strategies today: increase H2O intake, no skipping meals, keeping healthy foods in the home, increasing lean protein intake, decreasing simple carbohydrates, increasing vegetables, decrease eating out and work on meal planning and easy cooking plans [name] will weigh herself at home before each visit.  Destiny Rocha has agreed to follow up with our clinic in 2 weeks. She was informed of the importance of frequent follow up visits to maximize her success with intensive lifestyle modifications for her multiple health conditions.  ALLERGIES: Allergies  Allergen Reactions   Milk-Related Compounds Other (See Comments)    bloating   Morphine And Related Nausea And Vomiting    MEDICATIONS: Current Outpatient Medications on File Prior to Visit  Medication Sig Dispense Refill   Biotin 5000 MCG TABS Take 5,000 mcg by mouth daily.     cholecalciferol (VITAMIN D) 1000 UNITS tablet Take 1,000 Units by mouth every Monday, Wednesday, and Friday.     L-Lysine 1000 MG TABS Take 1,000 mg by mouth daily.     Melatonin 3 MG CAPS Take 3 mg by mouth at bedtime.     Multiple Vitamin (MULTIVITAMIN) tablet Take 1 tablet by mouth every other day.      Vitamin D, Ergocalciferol, (DRISDOL) 1.25 MG (50000 UT) CAPS capsule Take 1 capsule (50,000 Units total) by mouth every 7 (seven) days. 4 capsule 0   No current facility-administered medications on file prior to visit.     PAST MEDICAL HISTORY: Past Medical History:  Diagnosis Date   Anxiety    Arthritis    Complication of anesthesia    slow to awaken   Constipation    Depression      Dry eyes    Eczema    Elevated cholesterol    Family history of breast cancer    Family history of colon cancer    GERD (gastroesophageal reflux disease)    occasional   Headache    sinus   Peripheral vascular disease ([place])    Pneumonia as child   PONV (postoperative nausea and vomiting)    Pre-diabetes    Shortness of breath    Sleep disorder    Swelling of both lower extremities    Vitamin B12 deficiency    Vitamin D deficiency     PAST SURGICAL HISTORY: Past Surgical History:  Procedure Laterality Date   BREAST BIOPSY Left june 2016   normal, being checked every 6 months has breast marker   CESAREAN SECTION     x 2   KNEE SURGERY Bilateral    arthroscopy   removal of fatty tissue on  back     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 87s from unknown cancer   HIV/AIDS Brother        d. 44s   Stroke Maternal Uncle        d. 26   Seizures Sister        d. 50s   Stroke Brother    Breast cancer Daughter 57        Stage IV; ATM pos   Cancer Maternal Uncle        unknonw d. 35s   Breast cancer Cousin        dx. 40s    ROS: Review of Systems  Constitutional: Negative for weight loss.  Gastrointestinal: Negative for nausea and vomiting.  Musculoskeletal:       Negative for muscle weakness  Endo/Heme/Allergies:       Negative for hypoglycemia  Psychiatric/Behavioral: Positive for depression. Negative for suicidal ideas.       Positive for stress     PHYSICAL EXAM: Pt in no acute distress  RECENT LABS AND TESTS: BMET    Component Value Date/Time   NA 140 05/07/2018 0943   K 4.3 05/07/2018 0943   CL 101 05/07/2018 0943   CO2 25 05/07/2018 0943   GLUCOSE 95 05/07/2018 0943   GLUCOSE 128 (H) 03/05/2015 0445   BUN 15 05/07/2018 0943   CREATININE 0.86 05/07/2018 0943   CALCIUM 10.0 05/07/2018 0943   GFRNONAA 74 05/07/2018 0943   GFRAA 85 05/07/2018 0943   Lab Results  Component Value Date   HGBA1C 5.9 (H) 05/07/2018   Lab Results  Component Value Date   INSULIN 4.9 05/07/2018   CBC    Component Value Date/Time   WBC 8.1 03/06/2015 0437   RBC 3.30 (L) 03/06/2015 0437   HGB 9.6 (L) 03/06/2015 0437   HCT 28.4 (L) 03/06/2015 0437   PLT 203 03/06/2015 0437   MCV 86.1 03/06/2015 0437   MCH 29.1 03/06/2015 0437   MCHC 33.8 03/06/2015 0437   RDW 14.1 03/06/2015 0437   LYMPHSABS 2.2 02/24/2015 0900   MONOABS 0.4 02/24/2015 0900   EOSABS 0.2 02/24/2015 0900   BASOSABS 0.0 02/24/2015 0900   Iron/TIBC/Ferritin/ %Sat No results found for: IRON, TIBC, FERRITIN, IRONPCTSAT Lipid Panel     Component Value Date/Time   CHOL 219 (H) 05/07/2018 0943   TRIG 78 05/07/2018 0943   HDL 61 05/07/2018 0943   LDLCALC 142 (H) 05/07/2018 0943   Hepatic Function Panel     Component Value Date/Time   PROT 7.6 05/07/2018 0943   ALBUMIN 4.5 05/07/2018 0943   AST 14 05/07/2018 0943   ALT 14 05/07/2018 0943   ALKPHOS 72 05/07/2018 0943   BILITOT 0.6 05/07/2018 0943   No results found for: TSH    Ref. Range 05/07/2018 09:43  Vitamin D, 25-Hydroxy Latest Ref Range: 30.0 - 100.0 ng/mL 34.2    I, Doreene Nest, am acting as Location manager for [employer]. Owens Shark, DO  I have reviewed the above documentation for accuracy and completeness, and I agree with the above. -Jearld Lesch, DO
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
## 2018-08-19 ENCOUNTER — Other Ambulatory Visit: Payer: Self-pay

## 2018-08-19 ENCOUNTER — Ambulatory Visit (INDEPENDENT_AMBULATORY_CARE_PROVIDER_SITE_OTHER): Payer: 59 | Admitting: Psychology

## 2018-08-19 DIAGNOSIS — F3289 Other specified depressive episodes: Secondary | ICD-10-CM | POA: Diagnosis not present

## 2018-08-28 ENCOUNTER — Ambulatory Visit (INDEPENDENT_AMBULATORY_CARE_PROVIDER_SITE_OTHER): Payer: 59 | Admitting: Bariatrics

## 2018-09-02 ENCOUNTER — Ambulatory Visit (INDEPENDENT_AMBULATORY_CARE_PROVIDER_SITE_OTHER): Payer: 59 | Admitting: Psychology

## 2018-09-03 ENCOUNTER — Ambulatory Visit (INDEPENDENT_AMBULATORY_CARE_PROVIDER_SITE_OTHER): Payer: 59 | Admitting: Psychology

## 2018-09-03 ENCOUNTER — Other Ambulatory Visit: Payer: Self-pay

## 2018-09-03 DIAGNOSIS — F3289 Other specified depressive episodes: Secondary | ICD-10-CM

## 2018-09-03 NOTE — Progress Notes (Signed)
Office: [phone]  /  Fax: [phone]    Date: September 03, 2018    Appointment Start Time: 4:23pm Duration: 23 minutes Provider: Glennie Isle, Psy.D. Type of Session: Individual Therapy  Location of Patient: Home Location of Provider: [facility] Type of Contact: Telepsychological Visit via [employer] WebEx   Session Content: Destiny Rocha is a 63 y.o. female presenting via [employer] for a follow-up appointment to address the previously established treatment goal of decreasing emotional eating. This provider called Destiny Rocha at 4:03pm as she did not present for today's appointment. She requested the appointment be pushed back as she received a work call; this provider agreed. Shantella called this provider's office around 4:18pm and noted she was unable to find the e-mail with the secure link for today's appointment. Thus, this provider called Tyrhonda back and re-sent the link. As such, today's appointment was initiated 23 minutes late today. Today's appointment was a telepsychological visit, as this provider's clinic is seeing a limited number of patients for in-person visits due to COVID-19. Therapeutic services will resume to in-person appointments once deemed appropriate. Polette expressed understanding regarding the rationale for telepsychological services, and provided verbal consent for today's appointment. Prior to proceeding with today's appointment, [name]'s physical location at the time of this appointment was obtained. [name] reported she was at home and provided the address. In the event of technical difficulties, [name] shared a phone number she could be reached at. [name] and this provider participated in today's telepsychological service. Also, [name] denied anyone else being present in the room or on the WebEx appointment.  This provider conducted a brief check-in and verbally administered the PHQ-9 and GAD-7. Destiny Rocha reported she was able to  visit family in [place]. She described it as "relaxing." In addition, Destiny Rocha reported an increase in walking. Regarding physical versus emotional hunger, Destiny Rocha stated, "I didn't really complete the homework." While she expressed a plan to work towards weight loss, she also discussed a desire to explore bariatric surgery. Regarding emotional eating, Destiny Rocha denied episodes of emotional eating since the last appointment with this provider and described feeling in "control." Psychoeducation regarding triggers for emotional eating was provided. Destiny Rocha was provided a handout via e-mail, and encouraged to utilize the handout between now and the next appointment to increase awareness of triggers and frequency. Destiny Rocha agreed. This provider also discussed behavioral strategies for specific triggers, such as placing the utensil down when conversing to avoid mindless eating. Destiny Rocha provided verbal consent during today's appointment for this provider to send the handout via e-mail. Destiny Rocha was receptive to today's session as evidenced by openness to sharing, responsiveness to feedback, and willingness to identify triggers for emotional eating.  Mental Status Examination:  Appearance: neat Behavior: cooperative Mood: euthymic Affect: mood congruent Speech: normal in rate, volume, and tone Eye Contact: appropriate Psychomotor Activity: appropriate Thought Process: linear, logical, and goal directed  Content/Perceptual Disturbances: denies suicidal and homicidal ideation, plan, and intent and no hallucinations, delusions, bizarre thinking or behavior reported or observed Orientation: time, person, place and purpose of appointment Cognition/Sensorium: memory, attention, language, and fund of knowledge intact  Insight: good Judgment: good  Structured Assessment Results: The Patient Health Questionnaire-9 (PHQ-9) is a self-report measure that assesses symptoms and severity of depression over the  course of the last two weeks. Destiny Rocha obtained a score of 3 suggesting minimal depression. Destiny Rocha finds the endorsed symptoms to be not difficult at all. Little interest or pleasure in doing things 0  Feeling down, depressed, or hopeless  1  Trouble falling or staying asleep, or sleeping too much 1  Feeling tired or having little energy 0  Poor appetite or overeating 0  Feeling bad about yourself --- or that you are a failure or have let yourself or your family down 1  Trouble concentrating on things, such as reading the newspaper or watching television 0  Moving or speaking so slowly that other people could have noticed? Or the opposite --- being so fidgety or restless that you have been moving around a lot more than usual 0  Thoughts that you would be better off dead or hurting yourself in some way 0  PHQ-9 Score 3    The Generalized Anxiety Disorder-7 (GAD-7) is a brief self-report measure that assesses symptoms of anxiety over the course of the last two weeks. Destiny Rocha obtained a score of 4 suggesting minimal anxiety. Destiny Rocha finds the endorsed symptoms to be somewhat difficult. Feeling nervous, anxious, on edge 1  Not being able to stop or control worrying 1  Worrying too much about different things 0  Trouble relaxing 1  Being so restless that it's hard to sit still 0  Becoming easily annoyed or irritable 1  Feeling afraid as if something awful might happen 0  GAD-7 Score 4   Interventions:  Conducted a brief chart review Verbal administration of PHQ-9 and GAD-7 for symptom monitoring Provided empathic reflections and validation Reviewed content from the previous session Psychoeducation provided regarding triggers for emotional eating Provided positive reinforcement Focused on rapport building Employed supportive psychotherapy interventions to facilitate reduced distress, and to improve coping skills with identified stressors  DSM-5 Diagnosis: 311 (F32.8) Other Specified  Depressive Disorder, Emotional Eating Behaviors  Treatment Goal & Progress: During the initial appointment with this provider, the following treatment goal was established: decrease emotional eating. Progress is limited, as Tianni has just begun treatment with this provider; however, she is receptive to the interaction and interventions and rapport is being established.   Plan: Dhani continues to appear able and willing to participate as evidenced by engagement in reciprocal conversation, and asking questions for clarification as appropriate. The next appointment will be scheduled in two weeks, which will be via [employer]. Once this provider's office resumes in-person appointments and it is deemed appropriate, Aleila will be notified. The next session will focus on reviewing triggers for emotional eating, and the introduction of mindfulness.

## 2018-09-17 ENCOUNTER — Ambulatory Visit (INDEPENDENT_AMBULATORY_CARE_PROVIDER_SITE_OTHER): Payer: 59 | Admitting: Psychology

## 2018-10-03 ENCOUNTER — Ambulatory Visit (INDEPENDENT_AMBULATORY_CARE_PROVIDER_SITE_OTHER): Payer: 59 | Admitting: Bariatrics

## 2018-10-09 ENCOUNTER — Ambulatory Visit (INDEPENDENT_AMBULATORY_CARE_PROVIDER_SITE_OTHER): Payer: 59 | Admitting: Psychology

## 2018-12-11 ENCOUNTER — Telehealth: Payer: Self-pay

## 2018-12-11 ENCOUNTER — Other Ambulatory Visit: Payer: Self-pay

## 2018-12-11 NOTE — Telephone Encounter (Signed)
Patient left a voicemail asking to be called back regarding "some screening I'm supposed to have. I need to know the date/time/place. I was waiting on my insurance to approve it and I think they have. Please call me back". Looked Dr. Ernestina Penna last office note in February which mentioned the patient needed to get a mammogram and breast MRI in April. Saw that the MRI order had been approved but that the authorization expired back in June. Spoke with Mahalia Longest who was abe to renew the authorization and advised that I could call [facility] to get the scan scheduled. Spoke with the scheduler at GI who said the patient would need to have a mammogram first before her MRI could be scheduled. Admitted that I didn't know if the patient had her mammogram yet, but I would call the patient to find out, and then call GI back to get the patient scheduled. The scheduler informed me that the patient could call the office herself to ensure she got a date/time that worked best for her.   Called patient to convey above information. Patient mentioned that she already had a mammogram done but she couldn't remember the name of the facility. Informed her that if that was the case, she could call GI herself and get the breast MRI scheduled since it was approved again. GI number provided and patient verbalized understanding and agreement. Denied any further needs at this time, but knows to call our office if she has any issues of concerns.

## 2019-05-14 ENCOUNTER — Encounter: Payer: Self-pay | Admitting: Genetic Counselor

## 2019-10-13 ENCOUNTER — Ambulatory Visit: Payer: 59 | Attending: Internal Medicine

## 2019-10-13 DIAGNOSIS — Z23 Encounter for immunization: Secondary | ICD-10-CM

## 2019-10-13 NOTE — Progress Notes (Signed)
   Covid-19 Vaccination Clinic  Name:  MAIRANY BRUNO    MRN: [id number] DOB: 10-10-1955  10/13/2019  Ms. Bascom was observed post Covid-19 immunization for 15 minutes without incident. She was provided with Vaccine Information Sheet and instruction to access the V-Safe system.   Ms. Willaims was instructed to call 911 with any severe reactions post vaccine: Marland Kitchen Difficulty breathing  . Swelling of face and throat  . A fast heartbeat  . A bad rash all over body  . Dizziness and weakness   Immunizations Administered    Name Date Dose VIS Date Route   Pfizer COVID-19 Vaccine 10/13/2019  4:43 PM 0.3 mL 05/14/2018 Intramuscular   Manufacturer: [place]   Lot: [id number]   [place]: [id number]

## 2020-10-01 NOTE — Progress Notes (Signed)
Date:  10/04/2020   ID:  Destiny Rocha, DOB Sep 14, 1955, MRN [id number]  PCP:  Clayborn Heron, MD  Cardiologist:  Tessa Lerner, DO, [facility]  (established care 10/04/2020)  REASON FOR CONSULT: Shortness of breath   REQUESTING PHYSICIAN:  Rankins, Fanny Dance, MD [address] [place],  [place] [address]  Chief Complaint  Patient presents with   Shortness of Breath   New Patient (Initial Visit)    HPI  Destiny Rocha is a 65 y.o. female who presents to the office with a chief complaint of " shortness of breath." Patient's past medical history and cardiovascular risk factors include: Prediabetes, HLD, OSA not on CPAP, postmenopausal female, obesity due to excess calories.  She is referred to the office at the request of Rankins, Fanny Dance, MD for evaluation of shortness of breath.  She has been experiencing shortness of breath for the last 1 year but has been now getting progressively worse.  The symptoms are more pronounced with effort related activities,  and resolves within few minutes after resting.  She denies orthopnea, paroxysmal nocturnal dyspnea or lower extremity swelling.  Patient denies any precordial discomfort to suggest angina pectoris.  Patient has history of obstructive sleep apnea but is currently not on CPAP.  FUNCTIONAL STATUS: Enjoys doing stationary bike for 20-30 minutes and some resistance training.   ALLERGIES: Allergies  Allergen Reactions   Milk-Related Compounds Other (See Comments)    bloating   Morphine And Related Nausea And Vomiting    MEDICATION LIST PRIOR TO VISIT: Current Meds  Medication Sig   aspirin EC 81 MG tablet Take 81 mg by mouth daily. Swallow whole.   Biotin 5000 MCG TABS Take 5,000 mcg by mouth daily.   BLACK CURRANT SEED OIL PO Take 1 tablet by mouth daily as needed.   cholecalciferol (VITAMIN D) 1000 UNITS tablet Take 1,000 Units by mouth every Monday, Wednesday, and Friday.   CINNAMON PO Take 1,200 mg by  mouth once as needed.   cyanocobalamin 1000 MCG tablet Take 500 mcg by mouth daily.   L-Lysine 1000 MG TABS Take 1,000 mg by mouth daily.   Melatonin 3 MG CAPS Take 3 mg by mouth at bedtime.   metoprolol tartrate (LOPRESSOR) 25 MG tablet Take 1 tablet (25 mg total) by mouth 2 (two) times daily.   Multiple Vitamin (MULTIVITAMIN) tablet Take 1 tablet by mouth every other day.    NON FORMULARY [place] roots tea (upblends)   simvastatin (ZOCOR) 40 MG tablet Take 1 tablet by mouth daily at 6 (six) AM.   Turmeric (QC TUMERIC COMPLEX PO) Take 1,000 mg by mouth daily.     PAST MEDICAL HISTORY: Past Medical History:  Diagnosis Date   Anxiety    Arthritis    Complication of anesthesia    slow to awaken   Constipation    Depression    Dry eyes    Eczema    Elevated cholesterol    Family history of breast cancer    Family history of colon cancer    GERD (gastroesophageal reflux disease)    occasional   Headache    sinus   Peripheral vascular disease (HCC)    Pneumonia as child   PONV (postoperative nausea and vomiting)    Pre-diabetes    Shortness of breath    Sleep disorder    Swelling of both lower extremities    Vitamin B12 deficiency    Vitamin D deficiency     PAST  SURGICAL HISTORY: Past Surgical History:  Procedure Laterality Date   BREAST BIOPSY Left june 2016   normal, being checked every 6 months has breast marker   CESAREAN SECTION     x 2   KNEE SURGERY Bilateral    arthroscopy   removal of fatty tissue on  back     TOTAL KNEE ARTHROPLASTY Left 03/04/2015   Procedure: LEFT TOTAL KNEE ARTHROPLASTY;  Surgeon: Rod Can, MD;  Location: [facility];  Service: Orthopedics;  Laterality: Left;    FAMILY HISTORY: The patient family history includes Breast cancer in her cousin; Breast cancer (age of onset: 3) in her daughter; Cancer in her father and maternal uncle; Colon cancer in her mother; HIV/AIDS in her brother; Seizures in her sister; Stroke in her brother and  maternal uncle.  SOCIAL HISTORY:  The patient  reports that she has never smoked. She has never used smokeless tobacco. She reports current alcohol use. She reports that she does not use drugs.  REVIEW OF SYSTEMS: Review of Systems  Constitutional: Negative for chills and fever.  HENT:  Negative for hoarse voice and nosebleeds.   Eyes:  Negative for discharge, double vision and pain.  Cardiovascular:  Positive for dyspnea on exertion and leg swelling. Negative for chest pain, claudication, near-syncope, orthopnea, palpitations, paroxysmal nocturnal dyspnea and syncope.  Respiratory:  Positive for shortness of breath. Negative for hemoptysis.   Musculoskeletal:  Negative for muscle cramps and myalgias.       Muscle cramps  Gastrointestinal:  Negative for abdominal pain, constipation, diarrhea, hematemesis, hematochezia, melena, nausea and vomiting.  Neurological:  Negative for dizziness and light-headedness.   PHYSICAL EXAM: Vitals with BMI 10/04/2020 05/21/2018 05/09/2018  Height $Remov'5\' 7"'cEuEZo$  $Remove'5\' 7"'FmuoLTS$  $RemoveB'5\' 7"'VyYNfKyE$   Weight 282 lbs 276 lbs 284 lbs 2 oz  BMI 44.16 70.17 79.39  Systolic 030 092 330  Diastolic 82 73 77  Pulse 87 66 63    CONSTITUTIONAL: Well-developed and well-nourished. No acute distress.  SKIN: Skin is warm and dry. No rash noted. No cyanosis. No pallor. No jaundice HEAD: Normocephalic and atraumatic.  EYES: No scleral icterus MOUTH/THROAT: Moist oral membranes.  NECK: No JVD present. No thyromegaly noted. No carotid bruits  LYMPHATIC: No visible cervical adenopathy.  CHEST Normal respiratory effort. No intercostal retractions  LUNGS: Clear to auscultation bilaterally.  No stridor. No wheezes. No rales.  CARDIOVASCULAR: Regular, positive S1-S2, no murmurs rubs or gallops appreciated. ABDOMINAL: Obese, soft, nontender, nondistended, positive bowel sounds in all 4 quadrants no apparent ascites.  EXTREMITIES: No peripheral edema, warm to touch, 2+ dorsalis pedis and posterior tibial  pulses. HEMATOLOGIC: No significant bruising NEUROLOGIC: Oriented to person, place, and time. Nonfocal. Normal muscle tone.  PSYCHIATRIC: Normal mood and affect. Normal behavior. Cooperative  CARDIAC DATABASE: EKG: 10/04/2020: Normal sinus rhythm, 68 bpm, old anteroseptal infarct, without underlying ischemia or injury pattern.  Echocardiogram: No results found for this or any previous visit from the past 1095 days.   Stress Testing: No results found for this or any previous visit from the past 1095 days.  Heart Catheterization: None  LABORATORY DATA: CBC Latest Ref Rng & Units 03/06/2015 03/05/2015 02/24/2015  WBC 4.0 - 10.5 K/uL 8.1 10.1 5.6  Hemoglobin 12.0 - 15.0 g/dL 9.6(L) 11.6(L) 13.1  Hematocrit 36.0 - 46.0 % 28.4(L) 34.3(L) 39.6  Platelets 150 - 400 K/uL 203 221 285    CMP Latest Ref Rng & Units 05/07/2018 03/05/2015 02/24/2015  Glucose 65 - 99 mg/dL 95 128(H) 98  BUN 8 -  27 mg/dL $Remove'15 11 17  'iFLMbzF$ Creatinine 0.57 - 1.00 mg/dL 0.86 0.78 0.74  Sodium 134 - 144 mmol/L 140 140 139  Potassium 3.5 - 5.2 mmol/L 4.3 4.3 4.1  Chloride 96 - 106 mmol/L 101 107 106  CO2 20 - 29 mmol/L $RemoveB'25 26 28  'ubbkikGu$ Calcium 8.7 - 10.3 mg/dL 10.0 8.8(L) 9.1  Total Protein 6.0 - 8.5 g/dL 7.6 - 7.5  Total Bilirubin 0.0 - 1.2 mg/dL 0.6 - 0.6  Alkaline Phos 39 - 117 IU/L 72 - 65  AST 0 - 40 IU/L 14 - 20  ALT 0 - 32 IU/L 14 - 19    Lipid Panel     Component Value Date/Time   CHOL 219 (H) 05/07/2018 0943   TRIG 78 05/07/2018 0943   HDL 61 05/07/2018 0943   LDLCALC 142 (H) 05/07/2018 0943   LABVLDL 16 05/07/2018 0943    No components found for: NTPROBNP No results for input(s): PROBNP in the last 8760 hours. No results for input(s): TSH in the last 8760 hours.  BMP No results for input(s): NA, K, CL, CO2, GLUCOSE, BUN, CREATININE, CALCIUM, GFRNONAA, GFRAA in the last 8760 hours.  HEMOGLOBIN A1C Lab Results  Component Value Date   HGBA1C 5.9 (H) 05/07/2018   External Labs:  Date Collected:  07/22/2019 , information obtained by primary care provider Potassium: 4.3 Creatinine 0.76 mg/dL. eGFR: 93 mL/min per 1.73 m Lipid profile: Total cholesterol 245, triglycerides 65, HDL 58, LDL 176 AST: 16 , ALT: 15 , alkaline phosphatase: 63  Hemoglobin A1c: 5.9 TSH: 1.69    IMPRESSION:    ICD-10-CM   1. SOB (shortness of breath)  R06.02 EKG 12-Lead    CT CORONARY MORPH W/CTA COR W/SCORE W/CA W/CM &/OR WO/CM    PCV ECHOCARDIOGRAM COMPLETE    metoprolol tartrate (LOPRESSOR) 25 MG tablet    Basic metabolic panel    2. Pure hypercholesterolemia  E78.00     3. OSA (obstructive sleep apnea)  G47.33     4. Prediabetes  R73.03     5. Class 3 severe obesity due to excess calories without serious comorbidity with body mass index (BMI) of 40.0 to 44.9 in adult [facility])  E66.01    Z68.41        RECOMMENDATIONS: Destiny Rocha is a 65 y.o. female whose past medical history and cardiac risk factors include: Prediabetes, HLD, OSA not on CPAP, postmenopausal female, obesity due to excess calories.  Dyspnea on exertion: Patient is dyspnea on exertion may be due to multifactorial etiologies which include but not limited to undiagnosed hypertension, deconditioning due to obesity and a BMI of 44, and possible coronary artery disease.  Clinically patient is not volume overloaded to suggest heart failure. Echocardiogram will be ordered to evaluate for structural heart disease and left ventricular systolic function. Will recommend an ischemic evaluation.  However given her osteoarthritis of the knee is not able to exercise on a treadmill.  And given the underlying obesity I suspect that if she had Lexiscan she may be subject to soft tissue attenuation artifacts leading to false positive stress test.  The shared decision was to proceed with coronary CTA to evaluate for obstructive CAD and coronary artery calcium score. Further recommendations to follow upon completing the above-mentioned  testing.  Hypercholesterolemia:  Currently on statin therapy.   Does not endorse myalgias.   Currently managed by primary care provider.  Patient also states that she has an upcoming fasting lipid profile with her PCP.  Obstructive  sleep apnea:  Encouraged to obtain a referral to sleep medicine to have this reevaluated and if still present be treated for it.  Obesity, due to excess calories: Body mass index is 44.17 kg/m. I reviewed with the patient the importance of diet, regular physical activity/exercise, weight loss.   Patient is educated on increasing physical activity gradually as tolerated.  With the goal of moderate intensity exercise for 30 minutes a day 5 days a week.  FINAL MEDICATION LIST END OF ENCOUNTER: Meds ordered this encounter  Medications   metoprolol tartrate (LOPRESSOR) 25 MG tablet    Sig: Take 1 tablet (25 mg total) by mouth 2 (two) times daily.    Dispense:  60 tablet    Refill:  0     Medications Discontinued During This Encounter  Medication Reason   Vitamin D, Ergocalciferol, (DRISDOL) 1.25 MG (50000 UT) CAPS capsule Error   metFORMIN (GLUCOPHAGE) 500 MG tablet Error     Current Outpatient Medications:    aspirin EC 81 MG tablet, Take 81 mg by mouth daily. Swallow whole., Disp: , Rfl:    Biotin 5000 MCG TABS, Take 5,000 mcg by mouth daily., Disp: , Rfl:    BLACK CURRANT SEED OIL PO, Take 1 tablet by mouth daily as needed., Disp: , Rfl:    cholecalciferol (VITAMIN D) 1000 UNITS tablet, Take 1,000 Units by mouth every Monday, Wednesday, and Friday., Disp: , Rfl:    CINNAMON PO, Take 1,200 mg by mouth once as needed., Disp: , Rfl:    cyanocobalamin 1000 MCG tablet, Take 500 mcg by mouth daily., Disp: , Rfl:    L-Lysine 1000 MG TABS, Take 1,000 mg by mouth daily., Disp: , Rfl:    Melatonin 3 MG CAPS, Take 3 mg by mouth at bedtime., Disp: , Rfl:    metoprolol tartrate (LOPRESSOR) 25 MG tablet, Take 1 tablet (25 mg total) by mouth 2 (two) times daily.,  Disp: 60 tablet, Rfl: 0   Multiple Vitamin (MULTIVITAMIN) tablet, Take 1 tablet by mouth every other day. , Disp: , Rfl:    NON FORMULARY, [place] roots tea (upblends), Disp: , Rfl:    simvastatin (ZOCOR) 40 MG tablet, Take 1 tablet by mouth daily at 6 (six) AM., Disp: , Rfl:    Turmeric (QC TUMERIC COMPLEX PO), Take 1,000 mg by mouth daily., Disp: , Rfl:   Orders Placed This Encounter  Procedures   CT CORONARY MORPH W/CTA COR W/SCORE W/CA W/CM &/OR WO/CM   Basic metabolic panel   EKG [id number]   PCV ECHOCARDIOGRAM COMPLETE    There are no Patient Instructions on file for this visit.   --Continue cardiac medications as reconciled in final medication list. --Return in about 6 weeks (around 11/15/2020) for Follow up shortness breath. Or sooner if needed. --Continue follow-up with your primary care physician regarding the management of your other chronic comorbid conditions.  Patient's questions and concerns were addressed to her satisfaction. She voices understanding of the instructions provided during this encounter.   This note was created using a voice recognition software as a result there may be grammatical errors inadvertently enclosed that do not reflect the nature of this encounter. Every attempt is made to correct such errors.  Rex Kras, [place], [facility]  Pager: [phone] Office: [phone]

## 2020-10-04 ENCOUNTER — Ambulatory Visit: Payer: 59 | Admitting: Cardiology

## 2020-10-04 ENCOUNTER — Encounter: Payer: Self-pay | Admitting: Cardiology

## 2020-10-04 ENCOUNTER — Other Ambulatory Visit: Payer: Self-pay

## 2020-10-04 VITALS — BP 131/82 | HR 87 | Temp 97.7°F | Resp 17 | Ht 67.0 in | Wt 282.0 lb

## 2020-10-04 DIAGNOSIS — G4733 Obstructive sleep apnea (adult) (pediatric): Secondary | ICD-10-CM

## 2020-10-04 DIAGNOSIS — R0600 Dyspnea, unspecified: Secondary | ICD-10-CM

## 2020-10-04 DIAGNOSIS — R0609 Other forms of dyspnea: Secondary | ICD-10-CM

## 2020-10-04 DIAGNOSIS — E7800 Pure hypercholesterolemia, unspecified: Secondary | ICD-10-CM

## 2020-10-04 DIAGNOSIS — R7303 Prediabetes: Secondary | ICD-10-CM

## 2020-10-04 MED ORDER — METOPROLOL TARTRATE 25 MG PO TABS: 25.0000 mg | ORAL_TABLET | Freq: Two times a day (BID) | ORAL | 0 refills | Status: DC

## 2020-10-12 ENCOUNTER — Telehealth (HOSPITAL_COMMUNITY): Payer: Self-pay | Admitting: *Deleted

## 2020-10-12 ENCOUNTER — Other Ambulatory Visit: Payer: Self-pay

## 2020-10-12 ENCOUNTER — Ambulatory Visit: Payer: 59

## 2020-10-12 DIAGNOSIS — R0600 Dyspnea, unspecified: Secondary | ICD-10-CM

## 2020-10-12 DIAGNOSIS — R0609 Other forms of dyspnea: Secondary | ICD-10-CM

## 2020-10-12 NOTE — Telephone Encounter (Signed)
Reaching out to patient to offer assistance regarding upcoming cardiac imaging study; pt verbalizes understanding of appt date/time, parking situation and where to check in, pre-test NPO status and medications ordered, and verified current allergies; name and call back number provided for further questions should they arise  Gordy Clement RN Navigator Cardiac Imaging [facility] [phone] office [phone] cell  Patient to take '25mg'$  metoprolol tartrate in the morning and '25mg'$  metoprolol tartrate 2 hours prior to cardiac CT scan.

## 2020-10-13 ENCOUNTER — Ambulatory Visit (HOSPITAL_COMMUNITY): Admission: RE | Admit: 2020-10-13 | Payer: 59 | Source: Ambulatory Visit

## 2020-10-13 LAB — BASIC METABOLIC PANEL
BUN/Creatinine Ratio: 20 (ref 12–28)
BUN: 18 mg/dL (ref 8–27)
CO2: 23 mmol/L (ref 20–29)
Calcium: 9.4 mg/dL (ref 8.7–10.3)
Chloride: 107 mmol/L — ABNORMAL HIGH (ref 96–106)
Creatinine, Ser: 0.89 mg/dL (ref 0.57–1.00)
Glucose: 94 mg/dL (ref 65–99)
Potassium: 4.3 mmol/L (ref 3.5–5.2)
Sodium: 141 mmol/L (ref 134–144)
eGFR: 73 mL/min/{1.73_m2} (ref >59)

## 2020-10-14 ENCOUNTER — Ambulatory Visit (HOSPITAL_COMMUNITY): Admission: RE | Admit: 2020-10-14 | Payer: 59 | Source: Ambulatory Visit

## 2020-10-18 NOTE — Progress Notes (Signed)
Patient is aware of results.

## 2020-10-19 ENCOUNTER — Ambulatory Visit: Payer: 59 | Admitting: Cardiology

## 2020-10-19 DIAGNOSIS — G4733 Obstructive sleep apnea (adult) (pediatric): Secondary | ICD-10-CM

## 2020-10-19 DIAGNOSIS — E7800 Pure hypercholesterolemia, unspecified: Secondary | ICD-10-CM

## 2020-10-19 DIAGNOSIS — R7303 Prediabetes: Secondary | ICD-10-CM

## 2020-10-19 DIAGNOSIS — R0600 Dyspnea, unspecified: Secondary | ICD-10-CM

## 2020-11-01 ENCOUNTER — Other Ambulatory Visit: Payer: Self-pay | Admitting: Cardiology

## 2020-11-01 DIAGNOSIS — R0609 Other forms of dyspnea: Secondary | ICD-10-CM

## 2020-11-01 DIAGNOSIS — R0600 Dyspnea, unspecified: Secondary | ICD-10-CM

## 2020-11-16 ENCOUNTER — Ambulatory Visit: Payer: 59 | Admitting: Cardiology

## 2020-12-02 ENCOUNTER — Telehealth: Payer: Self-pay

## 2020-12-02 NOTE — Telephone Encounter (Signed)
Patient called requesting CT cardiac scoring results. Please advise.

## 2020-12-13 NOTE — Telephone Encounter (Signed)
Please have the patient schedule a follow-up visit.

## 2020-12-14 ENCOUNTER — Other Ambulatory Visit: Payer: Self-pay

## 2020-12-14 ENCOUNTER — Ambulatory Visit: Payer: 59 | Admitting: Cardiology

## 2020-12-14 ENCOUNTER — Encounter: Payer: Self-pay | Admitting: Cardiology

## 2020-12-14 VITALS — BP 119/69 | HR 75 | Resp 16 | Ht 67.0 in | Wt 283.0 lb

## 2020-12-14 DIAGNOSIS — E7800 Pure hypercholesterolemia, unspecified: Secondary | ICD-10-CM

## 2020-12-14 DIAGNOSIS — Z6841 Body Mass Index (BMI) 40.0 and over, adult: Secondary | ICD-10-CM

## 2020-12-14 DIAGNOSIS — R0602 Shortness of breath: Secondary | ICD-10-CM

## 2020-12-14 DIAGNOSIS — G4733 Obstructive sleep apnea (adult) (pediatric): Secondary | ICD-10-CM

## 2020-12-14 DIAGNOSIS — I340 Nonrheumatic mitral (valve) insufficiency: Secondary | ICD-10-CM

## 2020-12-14 DIAGNOSIS — I2510 Atherosclerotic heart disease of native coronary artery without angina pectoris: Secondary | ICD-10-CM

## 2020-12-14 DIAGNOSIS — R7303 Prediabetes: Secondary | ICD-10-CM

## 2020-12-14 NOTE — Telephone Encounter (Signed)
Patient is scheduled to come in for results

## 2020-12-14 NOTE — Progress Notes (Signed)
Date:  12/14/2020   ID:  Destiny Rocha, DOB 1955-06-13, MRN [id number]  PCP:  Clayborn Heron, MD  Cardiologist:  Tessa Lerner, DO, [facility]  (established care 10/04/2020)  Date: 12/14/20 Last Office Visit: 10/04/2020  Chief Complaint  Patient presents with   Dyspnea on exertion   Results   Follow-up    HPI  Destiny Rocha is a 65 y.o. female who presents to the office with a chief complaint of " shortness of breath." Patient's past medical history and cardiovascular risk factors include: Prediabetes, HLD, OSA not on CPAP, postmenopausal female, obesity due to excess calories.  She is referred to the office at the request of Rankins, Fanny Dance, MD for evaluation of shortness of breath.  During initial consultation patient was explaining her symptoms of shortness of breath have been present for approximately 1 year, getting progressively worse, brought on more by effort related activities, resolving with resting.  Given her symptoms, risk factors, decreased functional capacity, BMI of 44, and osteoarthritis that shared decision was to proceed with coronary CTA to evaluate for obstructive CAD.  Patient had her coronary CTA at [facility] in August 2022 and was noted to have mild coronary artery calcification without any obstructive CAD per report.  Results reviewed in great detail and noted below for further reference.  Clinically patient denies any chest pain or worsening shortness of breath since last office visit.  No hospitalizations for congestive heart failure or ACS since last office encounter.    FUNCTIONAL STATUS: Enjoys doing stationary bike for 20-30 minutes and some resistance training.   ALLERGIES: Allergies  Allergen Reactions   Milk-Related Compounds Other (See Comments)    bloating   Morphine And Related Nausea And Vomiting    MEDICATION LIST PRIOR TO VISIT: Current Meds  Medication Sig   aspirin EC 81 MG tablet Take 81 mg by mouth daily. Swallow whole.    Biotin 5000 MCG TABS Take 5,000 mcg by mouth daily.   Multiple Vitamin (MULTIVITAMIN) tablet Take 1 tablet by mouth every other day.    simvastatin (ZOCOR) 40 MG tablet Take 1 tablet by mouth daily at 6 (six) AM.   Turmeric (QC TUMERIC COMPLEX PO) Take 1,000 mg by mouth daily.   valACYclovir (VALTREX) 500 MG tablet Take 500 mg by mouth as needed.     PAST MEDICAL HISTORY: Past Medical History:  Diagnosis Date   Anxiety    Arthritis    Complication of anesthesia    slow to awaken   Constipation    Coronary atherosclerosis due to calcified coronary lesion    Depression    Dry eyes    Eczema    Elevated cholesterol    Family history of breast cancer    Family history of colon cancer    GERD (gastroesophageal reflux disease)    occasional   Headache    sinus   Peripheral vascular disease (HCC)    Pneumonia as child   PONV (postoperative nausea and vomiting)    Pre-diabetes    Prediabetes    Shortness of breath    Sleep disorder    Swelling of both lower extremities    Vitamin B12 deficiency    Vitamin D deficiency     PAST SURGICAL HISTORY: Past Surgical History:  Procedure Laterality Date   BREAST BIOPSY Left june 2016   normal, being checked every 6 months has breast marker   CESAREAN SECTION     x 2   KNEE SURGERY Bilateral  arthroscopy   removal of fatty tissue on  back     TOTAL KNEE ARTHROPLASTY Left 03/04/2015   Procedure: LEFT TOTAL KNEE ARTHROPLASTY;  Surgeon: Rod Can, MD;  Location: [facility];  Service: Orthopedics;  Laterality: Left;    FAMILY HISTORY: The patient family history includes Breast cancer in her cousin; Breast cancer (age of onset: 69) in her daughter; Cancer in her father and maternal uncle; Colon cancer in her mother; HIV/AIDS in her brother; Seizures in her sister; Stroke in her brother and maternal uncle.  SOCIAL HISTORY:  The patient  reports that she has never smoked. She has never used smokeless tobacco. She reports current  alcohol use. She reports that she does not use drugs.  REVIEW OF SYSTEMS: Review of Systems  Constitutional: Negative for chills and fever.  HENT:  Negative for hoarse voice and nosebleeds.   Eyes:  Negative for discharge, double vision and pain.  Cardiovascular:  Positive for dyspnea on exertion and leg swelling. Negative for chest pain, claudication, near-syncope, orthopnea, palpitations, paroxysmal nocturnal dyspnea and syncope.  Respiratory:  Positive for shortness of breath. Negative for hemoptysis.   Musculoskeletal:  Negative for muscle cramps and myalgias.       Muscle cramps  Gastrointestinal:  Negative for abdominal pain, constipation, diarrhea, hematemesis, hematochezia, melena, nausea and vomiting.  Neurological:  Negative for dizziness and light-headedness.   PHYSICAL EXAM: Vitals with BMI 12/14/2020 10/04/2020 05/21/2018  Height $Remov'5\' 7"'JpUZao$  $Remove'5\' 7"'pjPDOrN$  $RemoveB'5\' 7"'mpYOMJDl$   Weight 283 lbs 282 lbs 276 lbs  BMI 44.31 32.44 01.02  Systolic 725 366 440  Diastolic 69 82 73  Pulse 75 87 66    CONSTITUTIONAL: Well-developed and well-nourished. No acute distress.  SKIN: Skin is warm and dry. No rash noted. No cyanosis. No pallor. No jaundice HEAD: Normocephalic and atraumatic.  EYES: No scleral icterus MOUTH/THROAT: Moist oral membranes.  NECK: No JVD present. No thyromegaly noted. No carotid bruits  LYMPHATIC: No visible cervical adenopathy.  CHEST Normal respiratory effort. No intercostal retractions  LUNGS: Clear to auscultation bilaterally.  No stridor. No wheezes. No rales.  CARDIOVASCULAR: Regular, positive S1-S2, no murmurs rubs or gallops appreciated. ABDOMINAL: Obese, soft, nontender, nondistended, positive bowel sounds in all 4 quadrants no apparent ascites.  EXTREMITIES: No peripheral edema, warm to touch, 2+ dorsalis pedis and posterior tibial pulses. HEMATOLOGIC: No significant bruising NEUROLOGIC: Oriented to person, place, and time. Nonfocal. Normal muscle tone.  PSYCHIATRIC: Normal mood  and affect. Normal behavior. Cooperative  CARDIAC DATABASE: EKG: 10/04/2020: Normal sinus rhythm, 68 bpm, old anteroseptal infarct, without underlying ischemia or injury pattern.  Echocardiogram: 10/12/2020: Normal LV systolic function with visual EF 60-65%. Left ventricle cavity is normal in size. Normal left ventricular wall thickness. Normal global wall motion. Normal diastolic filling pattern, normal LAP. Mild (Grade I) mitral regurgitation. Mild tricuspid regurgitation. No evidence of pulmonary hypertension. No prior study for comparison.   Stress Testing: No results found for this or any previous visit from the past 1095 days.  Heart Catheterization: None  Coronary CTA: 11/12/2020 at [facility], records available via Care Everywhere -  Calcium score of 33.1.  This places the patient in the 80th percentile for age and race based on the [place] data base.  -  Within limitations of study, normal course and trajectory of coronary arteries without obstructing plaque.  -  Visualized abdomen: Focal hyperenhancement in the hepatic dome is indeterminate but may represent a flash filling hemangioma or perfusional anomaly.. Small sliding hiatal hernia.   LABORATORY  DATA: CBC Latest Ref Rng & Units 03/06/2015 03/05/2015 02/24/2015  WBC 4.0 - 10.5 K/uL 8.1 10.1 5.6  Hemoglobin 12.0 - 15.0 g/dL 9.6(L) 11.6(L) 13.1  Hematocrit 36.0 - 46.0 % 28.4(L) 34.3(L) 39.6  Platelets 150 - 400 K/uL 203 221 285    CMP Latest Ref Rng & Units 10/12/2020 05/07/2018 03/05/2015  Glucose 65 - 99 mg/dL 94 95 128(H)  BUN 8 - 27 mg/dL $Remove'18 15 11  'WaxrXqM$ Creatinine 0.57 - 1.00 mg/dL 0.89 0.86 0.78  Sodium 134 - 144 mmol/L 141 140 140  Potassium 3.5 - 5.2 mmol/L 4.3 4.3 4.3  Chloride 96 - 106 mmol/L 107(H) 101 107  CO2 20 - 29 mmol/L $RemoveB'23 25 26  'juFkJevW$ Calcium 8.7 - 10.3 mg/dL 9.4 10.0 8.8(L)  Total Protein 6.0 - 8.5 g/dL - 7.6 -  Total Bilirubin 0.0 - 1.2 mg/dL - 0.6 -  Alkaline Phos 39 - 117 IU/L - 72 -  AST 0 - 40 IU/L - 14  -  ALT 0 - 32 IU/L - 14 -    Lipid Panel     Component Value Date/Time   CHOL 219 (H) 05/07/2018 0943   TRIG 78 05/07/2018 0943   HDL 61 05/07/2018 0943   LDLCALC 142 (H) 05/07/2018 0943   LABVLDL 16 05/07/2018 0943    No components found for: NTPROBNP No results for input(s): PROBNP in the last 8760 hours. No results for input(s): TSH in the last 8760 hours.  BMP Recent Labs    10/12/20 1625  NA 141  K 4.3  CL 107*  CO2 23  GLUCOSE 94  BUN 18  CREATININE 0.89  CALCIUM 9.4    HEMOGLOBIN A1C Lab Results  Component Value Date   HGBA1C 5.9 (H) 05/07/2018   External Labs:  Date Collected: 07/22/2019 , information obtained by primary care provider Potassium: 4.3 Creatinine 0.76 mg/dL. eGFR: 93 mL/min per 1.73 m Lipid profile: Total cholesterol 245, triglycerides 65, HDL 58, LDL 176 AST: 16 , ALT: 15 , alkaline phosphatase: 63  Hemoglobin A1c: 5.9 TSH: 1.69    IMPRESSION:    ICD-10-CM   1. Coronary atherosclerosis due to calcified coronary lesion  I25.10    I25.84     2. Nonrheumatic mitral valve regurgitation  I34.0     3. SOB (shortness of breath)  R06.02     4. Pure hypercholesterolemia  E78.00     5. OSA (obstructive sleep apnea)  G47.33     6. Prediabetes  R73.03     7. Class 3 severe obesity due to excess calories without serious comorbidity with body mass index (BMI) of 40.0 to 44.9 in adult [facility])  E66.01    Z68.41        RECOMMENDATIONS: Destiny Rocha is a 65 y.o. female whose past medical history and cardiac risk factors include: Prediabetes, HLD, OSA not on CPAP, postmenopausal female, obesity due to excess calories.  Coronary atherosclerosis due to calcified coronary lesion Total coronary calcium score 33 AU, 80th percentile. No obstructive CAD most recent coronary CTA Currently on aspirin and statin therapy Educated on importance of secondary prevention and improving her modifiable cardiovascular risk factors. Monitor for  now.  Nonrheumatic mitral valve regurgitation Asymptomatic. Recommend 3 to 5 years to reevaluate disease progression.  SOB (shortness of breath) Chronic and stable. Not in congestive heart failure. Echocardiogram results reviewed with her in great detail. Patient is educated on following up with PCP with regards to further evaluation of noncardiac causes of shortness of  breath.  Pure hypercholesterolemia Currently on Simvastatin .   She denies myalgia or other side effects. Most recent lipids dated May 2022 reviewed as noted above. Currently managed by primary care provider.  Class 3 severe obesity due to excess calories without serious comorbidity with body mass index (BMI) of 40.0 to 44.9 in adult [facility]) Body mass index is 44.32 kg/m. Reviewed the importance of diet, regular physical activity/exercise, weight loss.   Encouraged increasing physical activity gradually as tolerated.  With the goal of moderate intensity exercise for 30 minutes a day 5 days a week.  Have asked the patient to review the noncardiac findings on the most recent coronary CTA results with her PCP and coordinate further care if clinically indicated.  I would like to see her back in 1 year after her yearly annual visit to review her labs and if any change in clinical status from a cardiovascular standpoint.  Patient is agreeable with the plan of care.  She is more than welcome to see [name] back if change in clinical status.  FINAL MEDICATION LIST END OF ENCOUNTER: No orders of the defined types were placed in this encounter.    Medications Discontinued During This Encounter  Medication Reason   BLACK CURRANT SEED OIL PO Error   cholecalciferol (VITAMIN D) 1000 UNITS tablet Error   CINNAMON PO Error   cyanocobalamin 1000 MCG tablet Error   Melatonin 3 MG CAPS Error   metoprolol tartrate (LOPRESSOR) 25 MG tablet Error   NON FORMULARY Error   L-Lysine 1000 MG TABS Error     Current Outpatient Medications:     aspirin EC 81 MG tablet, Take 81 mg by mouth daily. Swallow whole., Disp: , Rfl:    Biotin 5000 MCG TABS, Take 5,000 mcg by mouth daily., Disp: , Rfl:    Multiple Vitamin (MULTIVITAMIN) tablet, Take 1 tablet by mouth every other day. , Disp: , Rfl:    simvastatin (ZOCOR) 40 MG tablet, Take 1 tablet by mouth daily at 6 (six) AM., Disp: , Rfl:    Turmeric (QC TUMERIC COMPLEX PO), Take 1,000 mg by mouth daily., Disp: , Rfl:    valACYclovir (VALTREX) 500 MG tablet, Take 500 mg by mouth as needed., Disp: , Rfl:   No orders of the defined types were placed in this encounter.  There are no Patient Instructions on file for this visit.   --Continue cardiac medications as reconciled in final medication list. --Return in about 1 year (around 12/14/2021) for Follow up, Coronary artery calcification, secondary prevention. . Or sooner if needed. --Continue follow-up with your primary care physician regarding the management of your other chronic comorbid conditions.  Patient's questions and concerns were addressed to her satisfaction. She voices understanding of the instructions provided during this encounter.   This note was created using a voice recognition software as a result there may be grammatical errors inadvertently enclosed that do not reflect the nature of this encounter. Every attempt is made to correct such errors.  Rex Kras, [place], [facility]  Pager: [phone] Office: [phone]

## 2021-02-14 ENCOUNTER — Other Ambulatory Visit: Payer: Self-pay | Admitting: Family Medicine

## 2021-02-14 ENCOUNTER — Ambulatory Visit
Admission: RE | Admit: 2021-02-14 | Discharge: 2021-02-14 | Disposition: A | Discharge status: Home / Self Care | Payer: 59 | Source: Ambulatory Visit | Attending: Family Medicine | Admitting: Family Medicine

## 2021-02-14 DIAGNOSIS — M545 Low back pain, unspecified: Secondary | ICD-10-CM

## 2021-12-14 ENCOUNTER — Ambulatory Visit: Payer: 59 | Admitting: Cardiology

## 2022-02-21 ENCOUNTER — Ambulatory Visit: Payer: 59 | Admitting: Cardiology

## 2022-02-27 NOTE — H&P (Signed)
Subjective:     Patient ID: Destiny Rocha is a 65 y.o. female.   HPI   Presents for consultation mass back. Notes lipoma excision in remote past and recurrence notes several years ago. Has continued to grow and now has pulling sensation and pain with neck movement, worse when at computer for work.   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 Lives with spouse and adult granddaughter.   Review of Systems Remainder 12 point review negative    Objective:   Physical Exam Cardiovascular:     Rate and Rhythm: Normal rate and regular rhythm.     Heart sounds: Normal heart sounds.  Pulmonary:     Effort: Pulmonary effort is normal.     Breath sounds: Normal breath sounds.  Neurological:     Mental Status: She is oriented to person, place, and time.     MS : upper back transverse scar present, mass non mobile present inferior and right lateral to this 10 x 14 cm        Assessment:     Soft tissue mass back clinically lipoma    Plan:     Whether recurrent lipoma or new mass, counseled location and size would make it difficult to approach through prior scar. We agree to new vertical incision over mass. Reviewed scar maturation over months, risks recurrence, need for additional procedures pending pathology, seroma and need for treatment of this. Discussed OP surgery, GA. Desires to proceed prior to end of year.

## 2022-03-02 ENCOUNTER — Encounter (HOSPITAL_BASED_OUTPATIENT_CLINIC_OR_DEPARTMENT_OTHER): Payer: Self-pay | Admitting: Plastic Surgery

## 2022-03-03 ENCOUNTER — Ambulatory Visit: Payer: 59 | Admitting: Cardiology

## 2022-03-03 ENCOUNTER — Encounter: Payer: Self-pay | Admitting: Cardiology

## 2022-03-03 VITALS — BP 132/76 | HR 68 | Ht 68.0 in | Wt 270.0 lb

## 2022-03-03 DIAGNOSIS — G4733 Obstructive sleep apnea (adult) (pediatric): Secondary | ICD-10-CM

## 2022-03-03 DIAGNOSIS — R7303 Prediabetes: Secondary | ICD-10-CM

## 2022-03-03 DIAGNOSIS — I2510 Atherosclerotic heart disease of native coronary artery without angina pectoris: Secondary | ICD-10-CM

## 2022-03-03 DIAGNOSIS — E7800 Pure hypercholesterolemia, unspecified: Secondary | ICD-10-CM

## 2022-03-03 DIAGNOSIS — I340 Nonrheumatic mitral (valve) insufficiency: Secondary | ICD-10-CM

## 2022-03-03 NOTE — Progress Notes (Signed)
Date:  03/03/2022   ID:  Destiny Rocha, DOB 1955-08-12, MRN [id number]  PCP:  Aretta Nip, MD  Cardiologist:  Rex Kras, DO, [facility]  (established care 10/04/2020)  Date: 03/03/22 Last Office Visit: 12/14/2020  Chief Complaint  Patient presents with   Follow-up    1 year   Coronary Artery Disease    HPI  Destiny Rocha is a 66 y.o. female whose past medical history and cardiovascular risk factors include: Mild coronary artery calcification (33.1, 80th percentile), prediabetes, HLD, OSA not on CPAP, postmenopausal female, obesity due to excess calories.  Patient was originally referred to the practice for evaluation of shortness of breath.  Since then she has undergone ischemic workup including a coronary CTA at [facility] back in 2022.  She was noted to have mild coronary artery calcification without any obstructive CAD.  And she presents today for 1 year follow-up visit.  Since last 1 year she is increased her physical activity on a regular basis.  She does 30 minutes of cardio followed by mild resistance weight training and circuits.  She also started Contrave in the last couple months.  With both of these interventions she is lost approximately 13 pounds since last office visit for which she is congratulated for at today's visit.  She denies anginal discomfort or heart failure symptoms.  She plans to have lipoma excision from her back next week Friday at [facility].  No recent labs for review.  ALLERGIES: Allergies  Allergen Reactions   Milk-Related Compounds Other (See Comments)    bloating   Morphine And Related Nausea And Vomiting    MEDICATION LIST PRIOR TO VISIT: Current Meds  Medication Sig   Biotin 5000 MCG TABS Take 5,000 mcg by mouth daily.   LYSINE PO Take by mouth.   Multiple Vitamin (MULTIVITAMIN) tablet Take 1 tablet by mouth every other day.    simvastatin (ZOCOR) 40 MG tablet Take 1 tablet by mouth daily at 6 (six) AM.   Turmeric  (QC TUMERIC COMPLEX PO) Take 1,000 mg by mouth daily.   valACYclovir (VALTREX) 500 MG tablet Take 500 mg by mouth as needed.     PAST MEDICAL HISTORY: Past Medical History:  Diagnosis Date   Anxiety    Arthritis    Complication of anesthesia    slow to awaken   Constipation    Coronary atherosclerosis due to calcified coronary lesion    Depression    Dry eyes    Eczema    Elevated cholesterol    Family history of breast cancer    Family history of colon cancer    GERD (gastroesophageal reflux disease)    occasional   Headache    sinus   Peripheral vascular disease ([place])    Pneumonia as child   PONV (postoperative nausea and vomiting)    Pre-diabetes    Prediabetes    Shortness of breath    Sleep disorder    Swelling of both lower extremities    Vitamin B12 deficiency    Vitamin D deficiency     PAST SURGICAL HISTORY: Past Surgical History:  Procedure Laterality Date   BREAST BIOPSY Left june 2016   normal, being checked every 6 months has breast marker   CESAREAN SECTION     x 2   KNEE SURGERY Bilateral    arthroscopy   removal of fatty tissue on  back     TOTAL KNEE ARTHROPLASTY Left 03/04/2015   Procedure: LEFT TOTAL  KNEE ARTHROPLASTY;  Surgeon: Rod Can, MD;  Location: [facility];  Service: Orthopedics;  Laterality: Left;    FAMILY HISTORY: The patient family history includes Breast cancer in her cousin; Breast cancer (age of onset: 71) in her daughter; Cancer in her father and maternal uncle; Colon cancer in her mother; HIV/AIDS in her brother; Seizures in her sister; Stroke in her brother and maternal uncle.  SOCIAL HISTORY:  The patient  reports that she has never smoked. She has never used smokeless tobacco. She reports current alcohol use. She reports that she does not use drugs.  REVIEW OF SYSTEMS: Review of Systems  Constitutional: Positive for weight loss.  Cardiovascular:  Negative for chest pain, claudication, dyspnea on exertion, irregular  heartbeat, leg swelling, near-syncope, orthopnea, palpitations, paroxysmal nocturnal dyspnea and syncope.  Respiratory:  Negative for shortness of breath.   Hematologic/Lymphatic: Negative for bleeding problem.  Musculoskeletal:  Negative for muscle cramps and myalgias.  Neurological:  Negative for dizziness and light-headedness.    PHYSICAL EXAM:    03/03/2022   12:44 PM 03/02/2022   11:48 AM 12/14/2020    1:38 PM  Vitals with BMI  Height _0  _1  _2   Weight 270 lbs 280 lbs 283 lbs  BMI 41.06 87.86 76.72  Systolic 094  709  Diastolic 76  69  Pulse 68  75   Physical Exam  Constitutional: No distress.  Age appropriate, hemodynamically stable.   Neck: No JVD present.  Cardiovascular: Normal rate, regular rhythm, S1 normal, S2 normal, intact distal pulses and normal pulses. Exam reveals no gallop, no S3 and no S4.  No murmur heard. Pulses:      Popliteal pulses are 2+ on the right side and 2+ on the left side.       Dorsalis pedis pulses are 2+ on the right side and 2+ on the left side.  Pulmonary/Chest: Effort normal and breath sounds normal. No stridor. She has no wheezes. She has no rales.  Abdominal: Soft. Bowel sounds are normal. She exhibits no distension. There is no abdominal tenderness.  Musculoskeletal:        General: No edema.     Cervical back: Neck supple.  Neurological: She is alert and oriented to person, place, and time. She has intact cranial nerves (2-12).  Skin: Skin is warm and moist.   CARDIAC DATABASE: EKG: 12/12/2021 done at outside facility: Sinus bradycardia, 56 bpm, without underlying ischemia or injury pattern.  Echocardiogram: 10/12/2020: Normal LV systolic function with visual EF 60-65%. Left ventricle cavity is normal in size. Normal left ventricular wall thickness. Normal global wall motion. Normal diastolic filling pattern, normal LAP. Mild (Grade I) mitral regurgitation. Mild tricuspid regurgitation. No evidence of pulmonary  hypertension. No prior study for comparison.   Stress Testing: No results found for this or any previous visit from the past 1095 days.  Heart Catheterization: None  Coronary CTA: 11/12/2020 at [facility], records available via Care Everywhere -  Calcium score of 33.1.  This places the patient in the 80th percentile for age and race based on the [place] data base.  -  Within limitations of study, normal course and trajectory of coronary arteries without obstructing plaque.  -  Visualized abdomen: Focal hyperenhancement in the hepatic dome is indeterminate but may represent a flash filling hemangioma or perfusional anomaly.. Small sliding hiatal hernia.   LABORATORY DATA:    Latest Ref Rng & Units 03/06/2015    4:37 AM 03/05/2015    4:45 AM  02/24/2015    9:00 AM  CBC  WBC 4.0 - 10.5 K/uL 8.1  10.1  5.6   Hemoglobin 12.0 - 15.0 g/dL 9.6  11.6  13.1   Hematocrit 36.0 - 46.0 % 28.4  34.3  39.6   Platelets 150 - 400 K/uL 203  221  285        Latest Ref Rng & Units 10/12/2020    4:25 PM 05/07/2018    9:43 AM 03/05/2015    4:45 AM  CMP  Glucose 65 - 99 mg/dL 94  95  128   BUN 8 - 27 mg/dL _0 Creatinine 0.57 - 1.00 mg/dL 0.89  0.86  0.78   Sodium 134 - 144 mmol/L 141  140  140   Potassium 3.5 - 5.2 mmol/L 4.3  4.3  4.3   Chloride 96 - 106 mmol/L 107  101  107   CO2 20 - 29 mmol/L _1 Calcium 8.7 - 10.3 mg/dL 9.4  10.0  8.8   Total Protein 6.0 - 8.5 g/dL  7.6    Total Bilirubin 0.0 - 1.2 mg/dL  0.6    Alkaline Phos 39 - 117 IU/L  72    AST 0 - 40 IU/L  14    ALT 0 - 32 IU/L  14      Lipid Panel     Component Value Date/Time   CHOL 219 (H) 05/07/2018 0943   TRIG 78 05/07/2018 0943   HDL 61 05/07/2018 0943   LDLCALC 142 (H) 05/07/2018 0943   LABVLDL 16 05/07/2018 0943    No components found for: "NTPROBNP" No results for input(s): "PROBNP" in the last 8760 hours. No results for input(s): "TSH" in the last 8760 hours.  BMP No results for input(s):  "NA", "K", "CL", "CO2", "GLUCOSE", "BUN", "CREATININE", "CALCIUM", "GFRNONAA", "GFRAA" in the last 8760 hours.   HEMOGLOBIN A1C Lab Results  Component Value Date   HGBA1C 5.9 (H) 05/07/2018   External Labs:  Date Collected: 07/22/2019 , information obtained by primary care provider Potassium: 4.3 Creatinine 0.76 mg/dL. eGFR: 93 mL/min per 1.73 m Lipid profile: Total cholesterol 245, triglycerides 65, HDL 58, LDL 176 AST: 16 , ALT: 15 , alkaline phosphatase: 63  Hemoglobin A1c: 5.9 TSH: 1.69    IMPRESSION:    ICD-10-CM   1. Coronary atherosclerosis due to calcified coronary lesion  I25.10 EKG 12-Lead   I25.84 Lipid Panel With LDL/HDL Ratio    LDL cholesterol, direct    CMP14+EGFR    2. Nonrheumatic mitral valve regurgitation  I34.0 EKG 12-Lead    3. Pure hypercholesterolemia  E78.00 Lipid Panel With LDL/HDL Ratio    LDL cholesterol, direct    CMP14+EGFR    4. OSA (obstructive sleep apnea)  G47.33     5. Prediabetes  R73.03     6. Class 3 severe obesity due to excess calories without serious comorbidity with body mass index (BMI) of 40.0 to 44.9 in adult [facility])  E66.01    Z68.41        RECOMMENDATIONS: Destiny Rocha is a 66 y.o. female whose past medical history and cardiac risk factors include: Prediabetes, HLD, OSA not on CPAP, postmenopausal female, obesity due to excess calories.  Coronary atherosclerosis due to calcified coronary lesion Total coronary calcium score 33 AU, 80th percentile. No obstructive CAD most recent coronary CTA Patient refusing EKG today. Obtain EKG from September 2023 from PCPs office -independently interpreted  and noted above Continue aspirin and statin therapy. Will check a fasting lipid profile, direct LDL, and CMP  Nonrheumatic mitral valve regurgitation Asymptomatic. Recommend a follow-up echo in July 2025 is a 3-year follow-up study sooner if change in clinical condition  Pure hypercholesterolemia Currently on simvastatin.    She denies myalgia or other side effects. No recent labs available for review -patient states that she is due for recheck of her lipids. Check fasting lipids, direct LDL and CMP as noted above Currently managed by primary care provider.  OSA (obstructive sleep apnea) Currently not using device therapy. Reencouraged the importance of having herself reevaluated by sleep provider and to start device therapy if clinically warranted.  Class 3 severe obesity due to excess calories without serious comorbidity with body mass index (BMI) of 40.0 to 44.9 in adult [facility]) Body mass index is 41.05 kg/m. Has lost 13 pounds since last visit as discussed above. Congratulated her on initiating her weight loss journey and the progress that she is made. She is trying to make a strong effort on not starting weight loss medications to achieve her goals. I reviewed with the patient the importance of diet, regular physical activity/exercise, weight loss.   Patient is educated on increasing physical activity gradually as tolerated.  With the goal of moderate intensity exercise for 30 minutes a day 5 days a week.  FINAL MEDICATION LIST END OF ENCOUNTER: No orders of the defined types were placed in this encounter.    There are no discontinued medications.    Current Outpatient Medications:    Biotin 5000 MCG TABS, Take 5,000 mcg by mouth daily., Disp: , Rfl:    LYSINE PO, Take by mouth., Disp: , Rfl:    Multiple Vitamin (MULTIVITAMIN) tablet, Take 1 tablet by mouth every other day. , Disp: , Rfl:    simvastatin (ZOCOR) 40 MG tablet, Take 1 tablet by mouth daily at 6 (six) AM., Disp: , Rfl:    Turmeric (QC TUMERIC COMPLEX PO), Take 1,000 mg by mouth daily., Disp: , Rfl:    valACYclovir (VALTREX) 500 MG tablet, Take 500 mg by mouth as needed., Disp: , Rfl:    aspirin EC 81 MG tablet, Take 81 mg by mouth daily. Swallow whole. (Patient not taking: Reported on 03/03/2022), Disp: , Rfl:   Orders Placed This  Encounter  Procedures   Lipid Panel With LDL/HDL Ratio   LDL cholesterol, direct   CMP14+EGFR   EKG 12-Lead    There are no Patient Instructions on file for this visit.   --Continue cardiac medications as reconciled in final medication list. --Return in about 1 year (around 03/04/2023) for Annual follow up visit, Coronary artery calcification. Or sooner if needed. --Continue follow-up with your primary care physician regarding the management of your other chronic comorbid conditions.  Patient's questions and concerns were addressed to her satisfaction. She voices understanding of the instructions provided during this encounter.   This note was created using a voice recognition software as a result there may be grammatical errors inadvertently enclosed that do not reflect the nature of this encounter. Every attempt is made to correct such errors.  Rex Kras, [place], [facility]  Pager: [phone] Office: [phone]

## 2022-03-03 NOTE — Progress Notes (Signed)
       Patient Instructions  The night before surgery:  No food after midnight. ONLY clear liquids after midnight  The day of surgery (if you do NOT have diabetes):  Drink ONE (1) Pre-Surgery Clear Ensure as directed.   This drink was given to you during your hospital  pre-op appointment visit. The pre-op nurse will instruct you on the time to drink the  Pre-Surgery Ensure depending on your surgery time. Finish the drink at the designated time by the pre-op nurse.  Nothing else to drink after completing the  Pre-Surgery Clear Ensure.  The day of surgery (if you have diabetes): Drink ONE (1) Gatorade 2 (G2) as directed. This drink was given to you during your hospital  pre-op appointment visit.  The pre-op nurse will instruct you on the time to drink the   Gatorade 2 (G2) depending on your surgery time. Color of the Gatorade may vary. Red is not allowed. Nothing else to drink after completing the  Gatorade 2 (G2).         If you have questions, please contact your surgeon's office.  Patient given CHG soap with written and verbal instruction. Patient verbalized understanding.

## 2022-03-04 LAB — CMP14+EGFR
ALT: 16 IU/L (ref 0–32)
AST: 13 IU/L (ref 0–40)
Albumin/Globulin Ratio: 1.5 (ref 1.2–2.2)
Albumin: 4.5 g/dL (ref 3.9–4.9)
Alkaline Phosphatase: 73 IU/L (ref 44–121)
BUN/Creatinine Ratio: 19 (ref 12–28)
BUN: 16 mg/dL (ref 8–27)
Bilirubin Total: 0.8 mg/dL (ref 0.0–1.2)
CO2: 21 mmol/L (ref 20–29)
Calcium: 9.8 mg/dL (ref 8.7–10.3)
Chloride: 103 mmol/L (ref 96–106)
Creatinine, Ser: 0.84 mg/dL (ref 0.57–1.00)
Globulin, Total: 3 g/dL (ref 1.5–4.5)
Glucose: 87 mg/dL (ref 70–99)
Potassium: 4.4 mmol/L (ref 3.5–5.2)
Sodium: 143 mmol/L (ref 134–144)
Total Protein: 7.5 g/dL (ref 6.0–8.5)
eGFR: 78 mL/min/{1.73_m2} (ref >59)

## 2022-03-04 LAB — LDL CHOLESTEROL, DIRECT: LDL Direct: 113 mg/dL — ABNORMAL HIGH (ref 0–99)

## 2022-03-04 LAB — LIPID PANEL WITH LDL/HDL RATIO
Cholesterol, Total: 187 mg/dL (ref 100–199)
HDL: 61 mg/dL (ref >39)
LDL Chol Calc (NIH): 115 mg/dL — ABNORMAL HIGH (ref 0–99)
LDL/HDL Ratio: 1.9 ratio (ref 0.0–3.2)
Triglycerides: 56 mg/dL (ref 0–149)
VLDL Cholesterol Cal: 11 mg/dL (ref 5–40)

## 2022-03-07 NOTE — Progress Notes (Signed)
Called patient to inform her about her lab results. Patient mention if we could fax her results to her PCP and I did. Patient understood

## 2022-03-10 ENCOUNTER — Other Ambulatory Visit: Payer: Self-pay

## 2022-03-10 ENCOUNTER — Ambulatory Visit (HOSPITAL_BASED_OUTPATIENT_CLINIC_OR_DEPARTMENT_OTHER)
Admission: RE | Admit: 2022-03-10 | Discharge: 2022-03-10 | Disposition: A | Discharge status: Home / Self Care | Payer: 59 | Source: Ambulatory Visit | Attending: Plastic Surgery | Admitting: Plastic Surgery

## 2022-03-10 ENCOUNTER — Encounter (HOSPITAL_BASED_OUTPATIENT_CLINIC_OR_DEPARTMENT_OTHER): Payer: Self-pay | Admitting: Plastic Surgery

## 2022-03-10 ENCOUNTER — Ambulatory Visit (HOSPITAL_BASED_OUTPATIENT_CLINIC_OR_DEPARTMENT_OTHER): Payer: 59 | Admitting: Certified Registered"

## 2022-03-10 ENCOUNTER — Encounter (HOSPITAL_BASED_OUTPATIENT_CLINIC_OR_DEPARTMENT_OTHER)
Admission: RE | Disposition: A | Discharge status: Home / Self Care | Payer: Self-pay | Source: Ambulatory Visit | Attending: Plastic Surgery

## 2022-03-10 DIAGNOSIS — I2510 Atherosclerotic heart disease of native coronary artery without angina pectoris: Secondary | ICD-10-CM

## 2022-03-10 DIAGNOSIS — I739 Peripheral vascular disease, unspecified: Secondary | ICD-10-CM

## 2022-03-10 DIAGNOSIS — D171 Benign lipomatous neoplasm of skin and subcutaneous tissue of trunk: Secondary | ICD-10-CM | POA: Insufficient documentation

## 2022-03-10 DIAGNOSIS — R222 Localized swelling, mass and lump, trunk: Secondary | ICD-10-CM | POA: Diagnosis not present

## 2022-03-10 DIAGNOSIS — K219 Gastro-esophageal reflux disease without esophagitis: Secondary | ICD-10-CM | POA: Diagnosis not present

## 2022-03-10 DIAGNOSIS — Z01818 Encounter for other preprocedural examination: Secondary | ICD-10-CM

## 2022-03-10 HISTORY — PX: MASS EXCISION: SHX2000

## 2022-03-10 SURGERY — MINOR EXCISION OF MASS
Anesthesia: General | Site: Back

## 2022-03-10 MED ORDER — FENTANYL CITRATE (PF) 100 MCG/2ML IJ SOLN
INTRAMUSCULAR | Status: DC | PRN
  Administered 2022-03-10: 50 ug via INTRAVENOUS
  Administered 2022-03-10 (×2): 25 ug via INTRAVENOUS

## 2022-03-10 MED ORDER — BUPIVACAINE-EPINEPHRINE (PF) 0.5% -1:200000 IJ SOLN
INTRAMUSCULAR | Status: DC | PRN
  Administered 2022-03-10: 25 mL

## 2022-03-10 MED ORDER — TRAMADOL HCL 50 MG PO TABS: 50.0000 mg | ORAL_TABLET | Freq: Four times a day (QID) | ORAL | 0 refills | Status: AC | PRN

## 2022-03-10 MED ORDER — CEFAZOLIN SODIUM-DEXTROSE 1-4 GM/50ML-% IV SOLN
INTRAVENOUS | Status: AC
  Filled 2022-03-10: qty 50

## 2022-03-10 MED ORDER — ROCURONIUM BROMIDE 100 MG/10ML IV SOLN
INTRAVENOUS | Status: DC | PRN
  Administered 2022-03-10: 50 mg via INTRAVENOUS

## 2022-03-10 MED ORDER — LIDOCAINE HCL (CARDIAC) PF 100 MG/5ML IV SOSY
PREFILLED_SYRINGE | INTRAVENOUS | Status: DC | PRN
  Administered 2022-03-10: 60 mg via INTRAVENOUS

## 2022-03-10 MED ORDER — MIDAZOLAM HCL 2 MG/2ML IJ SOLN
INTRAMUSCULAR | Status: AC
  Filled 2022-03-10: qty 2

## 2022-03-10 MED ORDER — CHLORHEXIDINE GLUCONATE CLOTH 2 % EX PADS: 6.0000 | MEDICATED_PAD | Freq: Once | CUTANEOUS | Status: DC

## 2022-03-10 MED ORDER — CELECOXIB 200 MG PO CAPS
200.0000 mg | ORAL_CAPSULE | ORAL | Status: AC
  Administered 2022-03-10: 200 mg via ORAL

## 2022-03-10 MED ORDER — FENTANYL CITRATE (PF) 100 MCG/2ML IJ SOLN: 25.0000 ug | INTRAMUSCULAR | Status: DC | PRN

## 2022-03-10 MED ORDER — LACTATED RINGERS IV SOLN: INTRAVENOUS | Status: DC

## 2022-03-10 MED ORDER — MIDAZOLAM HCL 5 MG/5ML IJ SOLN
INTRAMUSCULAR | Status: DC | PRN
  Administered 2022-03-10: 2 mg via INTRAVENOUS

## 2022-03-10 MED ORDER — ONDANSETRON HCL 4 MG/2ML IJ SOLN
INTRAMUSCULAR | Status: DC | PRN
  Administered 2022-03-10: 4 mg via INTRAVENOUS

## 2022-03-10 MED ORDER — ACETAMINOPHEN 500 MG PO TABS
1000.0000 mg | ORAL_TABLET | ORAL | Status: AC
  Administered 2022-03-10: 1000 mg via ORAL

## 2022-03-10 MED ORDER — GABAPENTIN 300 MG PO CAPS
ORAL_CAPSULE | ORAL | Status: AC
  Filled 2022-03-10: qty 1

## 2022-03-10 MED ORDER — CEFAZOLIN SODIUM-DEXTROSE 2-4 GM/100ML-% IV SOLN
INTRAVENOUS | Status: AC
  Filled 2022-03-10: qty 100

## 2022-03-10 MED ORDER — EPHEDRINE SULFATE (PRESSORS) 50 MG/ML IJ SOLN
INTRAMUSCULAR | Status: DC | PRN
  Administered 2022-03-10: 10 mg via INTRAVENOUS
  Administered 2022-03-10: 5 mg via INTRAVENOUS

## 2022-03-10 MED ORDER — BUPIVACAINE-EPINEPHRINE (PF) 0.5% -1:200000 IJ SOLN
INTRAMUSCULAR | Status: AC
  Filled 2022-03-10: qty 30

## 2022-03-10 MED ORDER — SUGAMMADEX SODIUM 200 MG/2ML IV SOLN
INTRAVENOUS | Status: DC | PRN
  Administered 2022-03-10: 50 mg via INTRAVENOUS
  Administered 2022-03-10: 200 mg via INTRAVENOUS

## 2022-03-10 MED ORDER — DEXAMETHASONE SODIUM PHOSPHATE 4 MG/ML IJ SOLN
INTRAMUSCULAR | Status: DC | PRN
  Administered 2022-03-10: 5 mg via INTRAVENOUS

## 2022-03-10 MED ORDER — CEFAZOLIN SODIUM-DEXTROSE 2-4 GM/100ML-% IV SOLN
2.0000 g | INTRAVENOUS | Status: AC
  Administered 2022-03-10: 3 g via INTRAVENOUS

## 2022-03-10 MED ORDER — ACETAMINOPHEN 500 MG PO TABS
ORAL_TABLET | ORAL | Status: AC
  Filled 2022-03-10: qty 2

## 2022-03-10 MED ORDER — GABAPENTIN 300 MG PO CAPS
300.0000 mg | ORAL_CAPSULE | ORAL | Status: AC
  Administered 2022-03-10: 300 mg via ORAL

## 2022-03-10 MED ORDER — PROPOFOL 10 MG/ML IV BOLUS
INTRAVENOUS | Status: DC | PRN
  Administered 2022-03-10: 150 mg via INTRAVENOUS

## 2022-03-10 MED ORDER — DEXAMETHASONE SODIUM PHOSPHATE 4 MG/ML IJ SOLN: INTRAMUSCULAR | Status: DC | PRN

## 2022-03-10 MED ORDER — FENTANYL CITRATE (PF) 100 MCG/2ML IJ SOLN
INTRAMUSCULAR | Status: AC
  Filled 2022-03-10: qty 2

## 2022-03-10 MED ORDER — CELECOXIB 200 MG PO CAPS
ORAL_CAPSULE | ORAL | Status: AC
  Filled 2022-03-10: qty 1

## 2022-03-10 SURGICAL SUPPLY — 48 items
ADH SKN CLS APL DERMABOND .7 (GAUZE/BANDAGES/DRESSINGS) ×1
APL PRP STRL LF DISP 70% ISPRP (MISCELLANEOUS) ×1
APL SKNCLS STERI-STRIP NONHPOA (GAUZE/BANDAGES/DRESSINGS)
BENZOIN TINCTURE PRP APPL 2/3 (GAUZE/BANDAGES/DRESSINGS) IMPLANT
BLADE CLIPPER SURG (BLADE) IMPLANT
BLADE SURG 15 STRL LF DISP TIS (BLADE) ×2 IMPLANT
BLADE SURG 15 STRL SS (BLADE) ×2
CANISTER SUCT 1200ML W/VALVE (MISCELLANEOUS) IMPLANT
CHLORAPREP W/TINT 26 (MISCELLANEOUS) ×2 IMPLANT
COVER BACK TABLE 60X90IN (DRAPES) ×2 IMPLANT
COVER MAYO STAND STRL (DRAPES) ×2 IMPLANT
DERMABOND ADVANCED .7 DNX12 (GAUZE/BANDAGES/DRESSINGS) IMPLANT
DRAIN JP 10F RND SILICONE (MISCELLANEOUS) IMPLANT
DRAPE LAPAROTOMY 100X72 PEDS (DRAPES) IMPLANT
DRAPE UTILITY XL STRL (DRAPES) ×2 IMPLANT
ELECT COATED BLADE 2.86 ST (ELECTRODE) IMPLANT
ELECT REM PT RETURN 9FT ADLT (ELECTROSURGICAL) ×1
ELECTRODE REM PT RTRN 9FT ADLT (ELECTROSURGICAL) IMPLANT
EVACUATOR SILICONE 100CC (DRAIN) IMPLANT
GAUZE SPONGE 4X4 12PLY STRL LF (GAUZE/BANDAGES/DRESSINGS) IMPLANT
GLOVE BIO SURGEON STRL SZ 6 (GLOVE) ×2 IMPLANT
GLOVE BIOGEL PI IND STRL 7.5 (GLOVE) IMPLANT
GLOVE SURG SS PI 7.0 STRL IVOR (GLOVE) IMPLANT
GOWN STRL REUS W/ TWL LRG LVL3 (GOWN DISPOSABLE) ×4 IMPLANT
GOWN STRL REUS W/TWL LRG LVL3 (GOWN DISPOSABLE) ×2
NDL HYPO 27GX1-1/4 (NEEDLE) ×2 IMPLANT
NEEDLE HYPO 27GX1-1/4 (NEEDLE) ×1 IMPLANT
NS IRRIG 1000ML POUR BTL (IV SOLUTION) IMPLANT
PACK BASIN DAY SURGERY FS (CUSTOM PROCEDURE TRAY) ×2 IMPLANT
PENCIL SMOKE EVACUATOR (MISCELLANEOUS) ×2 IMPLANT
SLEEVE SCD COMPRESS KNEE MED (STOCKING) IMPLANT
SPONGE T-LAP 18X18 ~~LOC~~+RFID (SPONGE) IMPLANT
STAPLER VISISTAT 35W (STAPLE) ×2 IMPLANT
STRIP CLOSURE SKIN 1/2X4 (GAUZE/BANDAGES/DRESSINGS) IMPLANT
SUT ETHILON 4 0 PS 2 18 (SUTURE) IMPLANT
SUT MNCRL AB 4-0 PS2 18 (SUTURE) IMPLANT
SUT MON AB 5-0 P3 18 (SUTURE) IMPLANT
SUT PDS AB 2-0 CT2 27 (SUTURE) IMPLANT
SUT PLAIN 5 0 P 3 18 (SUTURE) IMPLANT
SUT PROLENE 5 0 P 3 (SUTURE) IMPLANT
SUT PROLENE 6 0 P 1 18 (SUTURE) IMPLANT
SUT VIC AB 3-0 PS2 18 (SUTURE) IMPLANT
SUT VICRYL 4-0 PS2 18IN ABS (SUTURE) IMPLANT
SYR BULB EAR ULCER 3OZ GRN STR (SYRINGE) IMPLANT
SYR CONTROL 10ML LL (SYRINGE) ×2 IMPLANT
TOWEL GREEN STERILE FF (TOWEL DISPOSABLE) ×2 IMPLANT
TUBE CONNECTING 20X1/4 (TUBING) IMPLANT
YANKAUER SUCT BULB TIP NO VENT (SUCTIONS) IMPLANT

## 2022-03-10 NOTE — Op Note (Signed)
Operative Note   DATE OF OPERATION: 12.22.23  LOCATION: [facility]-outpatient  SURGICAL DIVISION: Plastic Surgery  PREOPERATIVE DIAGNOSES:  soft tissue mass back  POSTOPERATIVE DIAGNOSES:  same  PROCEDURE:  Excision subfascial mass back 15 cm  SURGEON: Irene Limbo MD MBA  ASSISTANT: none  ANESTHESIA:  General.   EBL: 15 ml  COMPLICATIONS: None immediate.   INDICATIONS FOR PROCEDURE:  The patient, Destiny Rocha, is a 65 y.o. female born on 11-Apr-1955, is here for treatment recurrent mass back.   FINDINGS: Clinically lipoma .  DESCRIPTION OF PROCEDURE:  The patient's operative site was marked with the patient in the preoperative area. The patient was taken to the operating room. SCDs were placed and IV antibiotics were given. Following intubation, patient placed in prone position. The patient's operative site was prepped and draped in a sterile fashion. A time out was performed and all information was confirmed to be correct. Vertical incision made over mass right lateral to midline. Incision carried through superficial fascia. Skin flaps elevated off mass. Mass excised off underlying muscle fascia. Diameter mass 15 cm. Wound irrigated and hemostasis obtained. Local anesthetic infiltrated. 2-0 PDS sutures used to plicate elevated skin flaps to muscle. Incision closed with 2-0 PDS in superficial fascia as three point suture to fascia. 3-0 vicryl used to approximate dermis followed by 4-0 monocryl subcuticular skin closure. Dermabond applied. Dry dressing and tape applied.  Patient returned to supine position. The patient was allowed to wake from anesthesia, extubated and taken to the recovery room in satisfactory condition.   SPECIMENS: soft tissue mass back  DRAINS: none  Irene Limbo, MD [facility]  Office/ physician access line after hours [phone]

## 2022-03-10 NOTE — Anesthesia Preprocedure Evaluation (Addendum)
Anesthesia Evaluation  Patient identified by MRN, date of birth, ID band Patient awake    Reviewed: Allergy & Precautions, NPO status , Patient's Chart, lab work & pertinent test results  History of Anesthesia Complications (+) PONV and history of anesthetic complications  Airway Mallampati: II       Dental   Pulmonary    breath sounds clear to auscultation       Cardiovascular + CAD and + Peripheral Vascular Disease   Rhythm:Regular Rate:Normal  History noted Dr. Nyoka Cowden   Neuro/Psych    GI/Hepatic Neg liver ROS,GERD  ,,  Endo/Other  negative endocrine ROS    Renal/GU negative Renal ROS     Musculoskeletal   Abdominal   Peds  Hematology   Anesthesia Other Findings   Reproductive/Obstetrics                             Anesthesia Physical Anesthesia Plan  ASA: 3  Anesthesia Plan: General   Post-op Pain Management: Tylenol PO (pre-op)*   Induction:   PONV Risk Score and Plan: 4 or greater and Ondansetron, Dexamethasone and Midazolam  Airway Management Planned: Oral ETT  Additional Equipment:   Intra-op Plan:   Post-operative Plan: Extubation in OR  Informed Consent: I have reviewed the patients History and Physical, chart, labs and discussed the procedure including the risks, benefits and alternatives for the proposed anesthesia with the patient or authorized representative who has indicated his/her understanding and acceptance.     Dental advisory given  Plan Discussed with: CRNA and Anesthesiologist  Anesthesia Plan Comments:        Anesthesia Quick Evaluation

## 2022-03-10 NOTE — Interval H&P Note (Signed)
History and Physical Interval Note:  03/10/2022 11:48 AM  Destiny Rocha  has presented today for surgery, with the diagnosis of soft tissue mass back.  The various methods of treatment have been discussed with the patient and family. After consideration of risks, benefits and other options for treatment, the patient has consented to  Procedure(s): EXCISION SUBCUTANEOUS VS SUBFASCIAL MASS BACK 10-14cm (N/A) as a surgical intervention.  The patient's history has been reviewed, patient examined, no change in status, stable for surgery.  I have reviewed the patient's chart and labs.  Questions were answered to the patient's satisfaction.     Arnoldo Hooker Drevion [place]ord

## 2022-03-10 NOTE — Discharge Instructions (Signed)
  Post Anesthesia Home Care Instructions  Activity: Get plenty of rest for the remainder of the day. A responsible individual must stay with you for 24 hours following the procedure.  For the next 24 hours, DO NOT: -Drive a car -Paediatric nurse -Drink alcoholic beverages -Take any medication unless instructed by your physician -Make any legal decisions or sign important papers.  Meals: Start with liquid foods such as gelatin or soup. Progress to regular foods as tolerated. Avoid greasy, spicy, heavy foods. If nausea and/or vomiting occur, drink only clear liquids until the nausea and/or vomiting subsides. Call your physician if vomiting continues.  Special Instructions/Symptoms: Your throat may feel dry or sore from the anesthesia or the breathing tube placed in your throat during surgery. If this causes discomfort, gargle with warm salt water. The discomfort should disappear within 24 hours.  If you had a scopolamine patch placed behind your ear for the management of post- operative nausea and/or vomiting:  1. The medication in the patch is effective for 72 hours, after which it should be removed.  Wrap patch in a tissue and discard in the trash. Wash hands thoroughly with soap and water. 2. You may remove the patch earlier than 72 hours if you experience unpleasant side effects which may include dry mouth, dizziness or visual disturbances. 3. Avoid touching the patch. Wash your hands with soap and water after contact with the patch.     May have Tylenol/Ibuprofen today after 3:40 PM

## 2022-03-10 NOTE — Transfer of Care (Signed)
Immediate Anesthesia Transfer of Care Note  Patient: MARGAURITE SALIDO  Procedure(s) Performed: EXCISION SUBFASCIAL MASS BACK 15cm (Back)  Patient Location: PACU  Anesthesia Type:General  Level of Consciousness: drowsy  Airway & Oxygen Therapy: Patient Spontanous Breathing and Patient connected to face mask oxygen  Post-op Assessment: Report given to RN and Post -op Vital signs reviewed and stable  Post vital signs: Reviewed and stable  Last Vitals:  Vitals Value Taken Time  BP 124/63 03/10/22 1419  Temp    Pulse 66 03/10/22 1422  Resp 21 03/10/22 1422  SpO2 99 % 03/10/22 1422  Vitals shown include unvalidated device data.  Last Pain:  Vitals:   03/10/22 0934  TempSrc: Oral  PainSc: 0-No pain      Patients Stated Pain Goal: 3 (44/69/50 7225)  Complications: No notable events documented.

## 2022-03-10 NOTE — Anesthesia Procedure Notes (Signed)
Procedure Name: Intubation Date/Time: 03/10/2022 12:35 PM  Performed by: Willa Frater, CRNAPre-anesthesia Checklist: Patient identified, Emergency Drugs available, Suction available and Patient being monitored Patient Re-evaluated:Patient Re-evaluated prior to induction Oxygen Delivery Method: Circle system utilized Preoxygenation: Pre-oxygenation with 100% oxygen Induction Type: IV induction Ventilation: Mask ventilation without difficulty Laryngoscope Size: Miller and 3 Grade View: Grade I Tube type: Oral Number of attempts: 1 Airway Equipment and Method: Stylet and Oral airway Placement Confirmation: ETT inserted through vocal cords under direct vision, positive ETCO2 and breath sounds checked- equal and bilateral Secured at: 22 cm Tube secured with: Tape Dental Injury: Teeth and Oropharynx as per pre-operative assessment

## 2022-03-11 NOTE — Anesthesia Postprocedure Evaluation (Signed)
Anesthesia Post Note  Patient: Destiny Rocha  Procedure(s) Performed: EXCISION SUBFASCIAL MASS BACK 15cm (Back)     Patient location during evaluation: PACU Anesthesia Type: General Level of consciousness: awake Pain management: pain level controlled Respiratory status: spontaneous breathing Cardiovascular status: stable Postop Assessment: no apparent nausea or vomiting Anesthetic complications: no   No notable events documented.  Last Vitals:  Vitals:   03/10/22 1445 03/10/22 1531  BP: 111/75 125/76  Pulse: 60 70  Resp: (!) 9 16  Temp:  36.5 C  SpO2: 97% 97%    Last Pain:  Vitals:   03/10/22 1531  TempSrc:   PainSc: 4                  Cassidi Modesitt

## 2022-03-14 LAB — SURGICAL PATHOLOGY

## 2022-06-16 IMAGING — CR DG LUMBAR SPINE COMPLETE 4+V
5 series · 5 of 5 positions shown · non-contrast
Comparison: None.

CLINICAL DATA: Lumbar back pain, no known injury

EXAM:
LUMBAR SPINE - COMPLETE 4+ VIEW

[w lumbar spine ap]
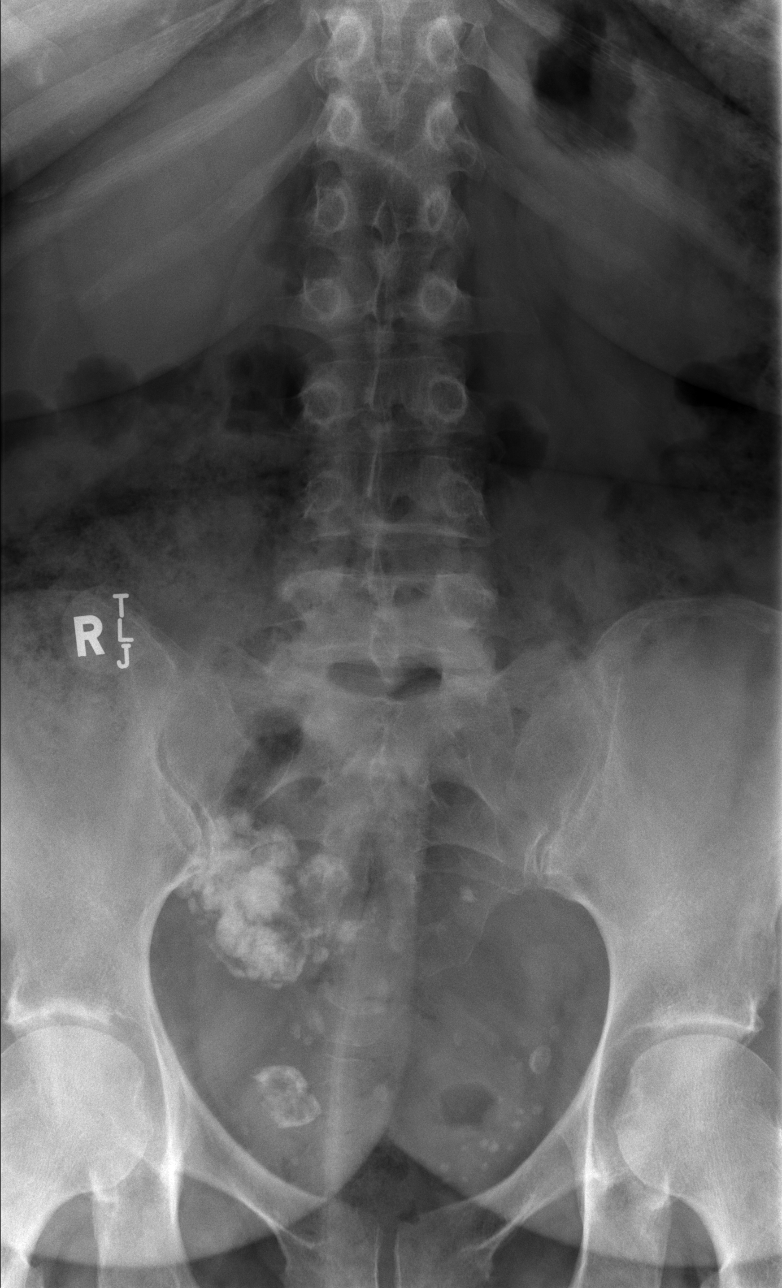

[w lumbar spine obl (1 of 2)]
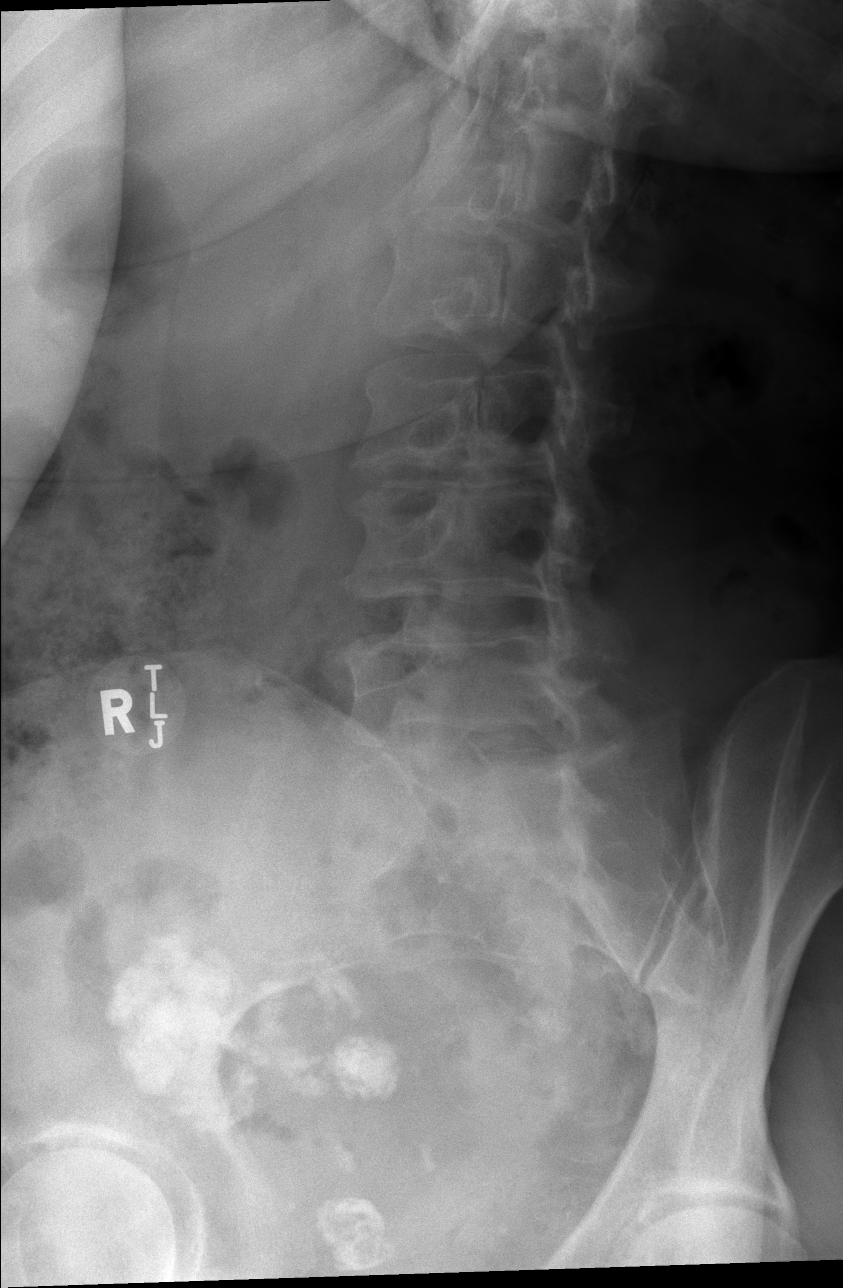

[w lumbar spine obl (2 of 2)]
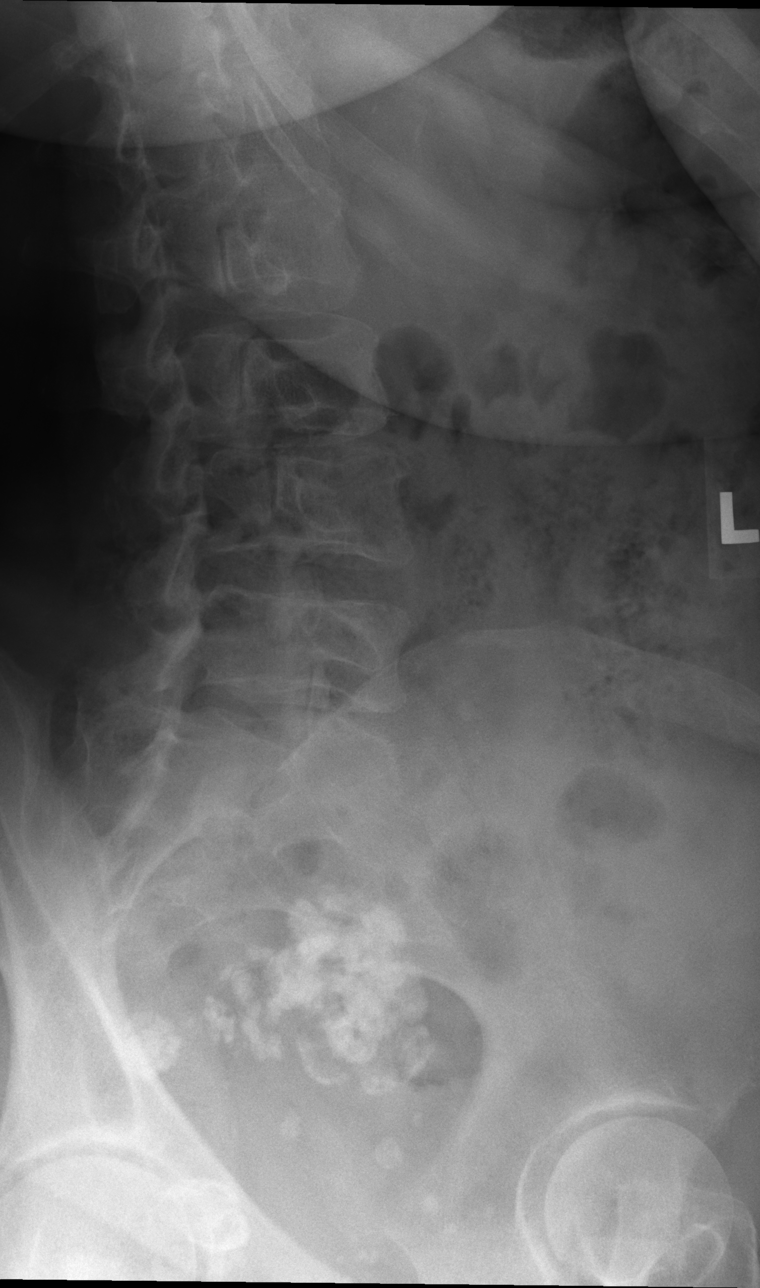

[w lumbar spine lat]
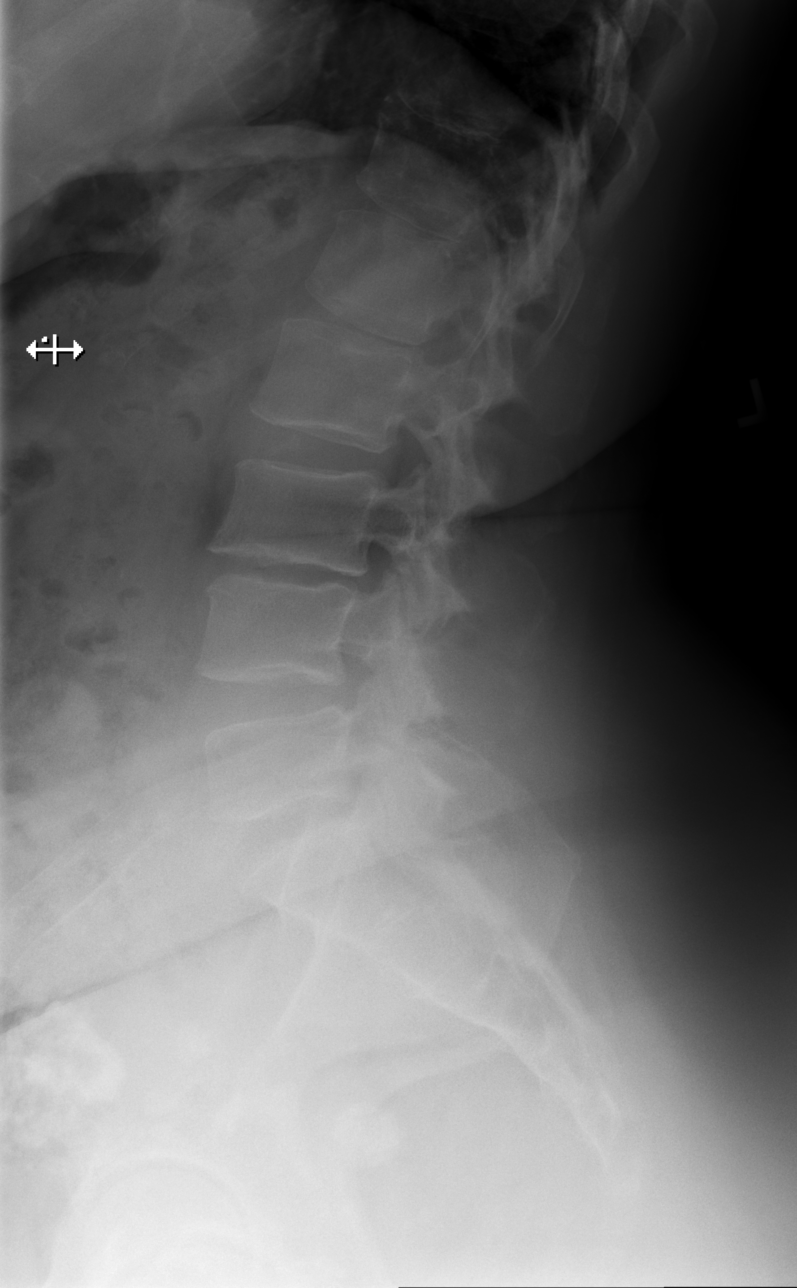

[w lumbar l-5 s-1 spot]
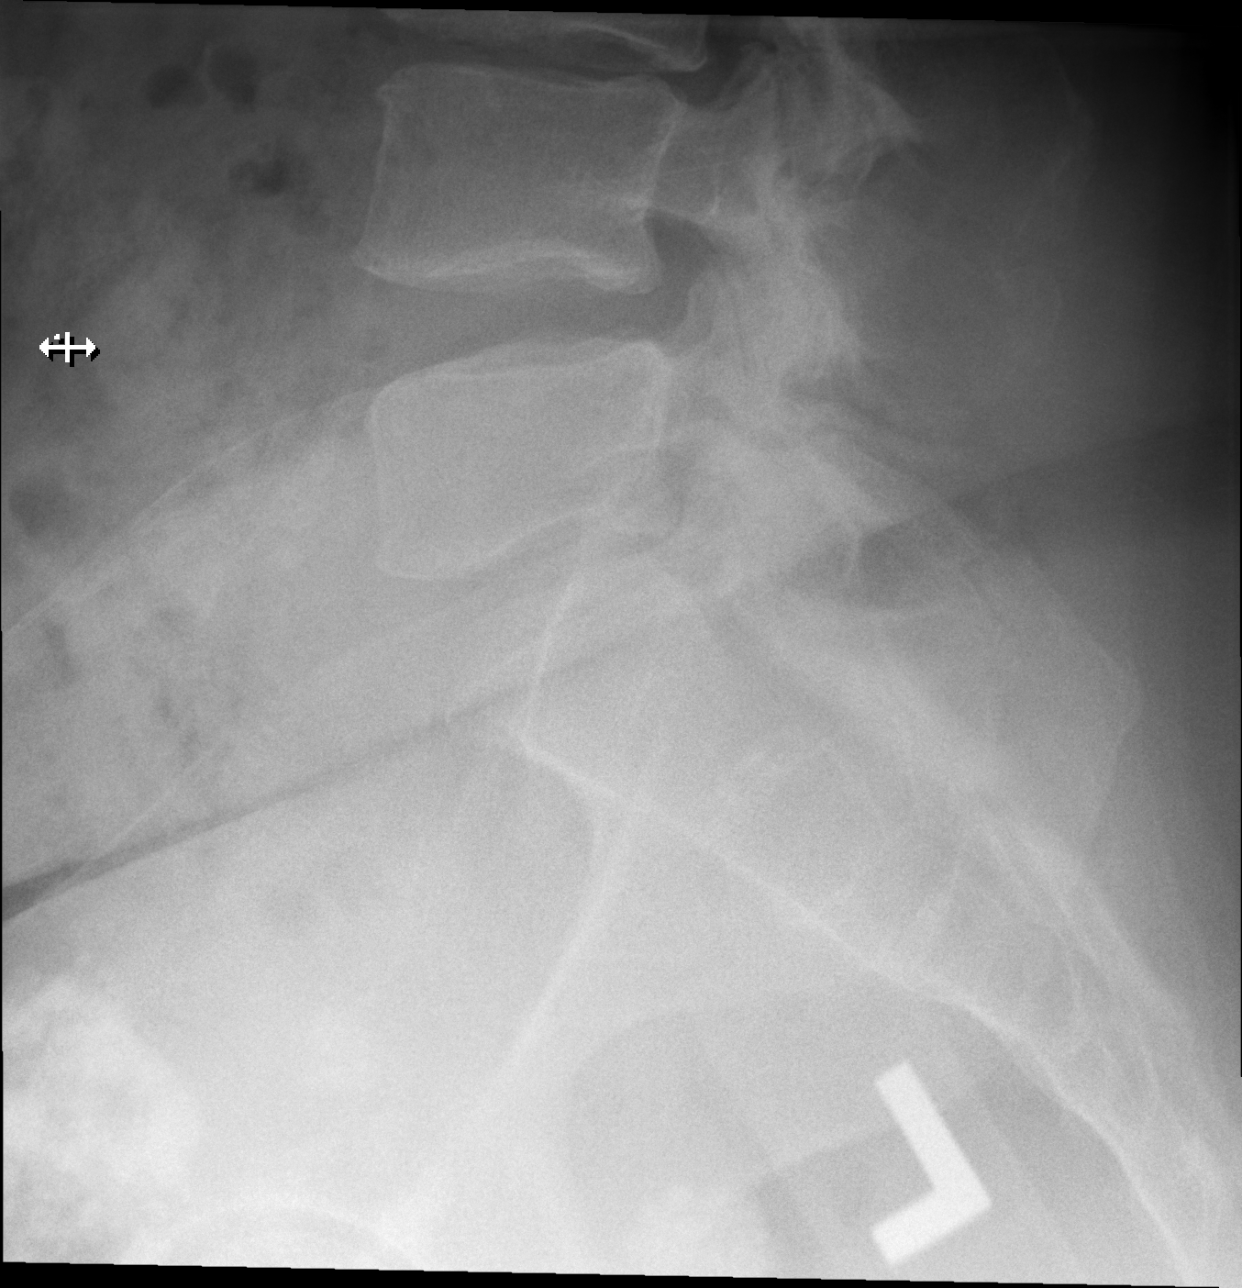

[5 of 5 positions shown; findings below may reference images not displayed]

FINDINGS: No fracture or dislocation of the lumbar spine. There is focally
mild disc space height loss and osteophytosis of L3-L4 with
otherwise preserved lumbar disc spaces. Mild multilevel facet
degenerative change. Nonobstructive pattern of overlying bowel gas.
Calcified uterine fibroids.
IMPRESSION: 1.  No fracture or dislocation of the lumbar spine.

2. There is focally mild disc space height loss and osteophytosis of
L3-L4 with otherwise preserved lumbar disc spaces. Mild multilevel
facet degenerative change. Lumbar disc and neural foraminal
pathology may be further evaluated by MRI if indicated by
neurologically localizing signs and symptoms.

## 2023-03-05 ENCOUNTER — Ambulatory Visit: Payer: 59 | Admitting: Cardiology

## 2023-03-09 ENCOUNTER — Ambulatory Visit: Payer: 59 | Attending: Cardiology | Admitting: Cardiology

## 2023-03-16 ENCOUNTER — Ambulatory Visit (INDEPENDENT_AMBULATORY_CARE_PROVIDER_SITE_OTHER): Payer: 59 | Admitting: Podiatry

## 2023-03-16 ENCOUNTER — Encounter: Payer: Self-pay | Admitting: Podiatry

## 2023-03-16 VITALS — Ht 68.0 in | Wt 273.0 lb

## 2023-03-16 DIAGNOSIS — L600 Ingrowing nail: Secondary | ICD-10-CM | POA: Diagnosis not present

## 2023-03-16 DIAGNOSIS — M21619 Bunion of unspecified foot: Secondary | ICD-10-CM

## 2023-03-16 NOTE — Patient Instructions (Signed)

## 2023-03-18 NOTE — Progress Notes (Signed)
Subjective:   Patient ID: Destiny Rocha, female   DOB: 67 y.o.   MRN: [id number]   HPI Patient presents stating she has a painful ingrown toenail deformity left big toe and it has been going on now for several months.  Patient does not smoke likes to be active   Review of Systems  All other systems reviewed and are negative.       Objective:  Physical Exam Vitals and nursing note reviewed.  Constitutional:      Appearance: She is well-developed.  Pulmonary:     Effort: Pulmonary effort is normal.  Musculoskeletal:        General: Normal range of motion.  Skin:    General: Skin is warm.  Neurological:     Mental Status: She is alert.     Neurovascular status intact muscle strength adequate range of motion adequate with incurvated medial border left big toe painful) is localized to the area with no erythema edema or drainage associated with it.  Good digital perfusion well-oriented     Assessment:  Ingrown toenail deformity of the left big toe medial border painful when pressed     Plan:  Chronic ingrown toenail deformity left hallux medial border with pain that I discussed with her and did H&P.  I do recommend removal of the corner explained procedure risk patient wants surgery understanding risk and today I anesthetized 60 mg like Marcaine mixture sterile prep done and using sterile instrumentation remove the medial border exposed matrix applied phenol 3 applications 30 seconds followed by alcohol lavage sterile dressing gave instructions on soaks and leave dressing on 24 hours taken off earlier if throbbing were to occur and to call with any questions which may arise

## 2023-03-28 ENCOUNTER — Telehealth: Payer: Self-pay | Admitting: Podiatry

## 2023-03-28 NOTE — Telephone Encounter (Signed)
 Patient called and stated that she is still having some discomfort and oozing from her ingrown nail that was removed (12/27). Could you please give her a call and go over some concerns she may have. She stated she has done the soaking instructions since having it removed but hasn't had any kind of improvement and she stated she had also put peroxide on it and noticed it was fizzing as if it was infected.

## 2023-04-04 ENCOUNTER — Ambulatory Visit: Payer: 59 | Admitting: Podiatry

## 2023-04-04 ENCOUNTER — Encounter: Payer: Self-pay | Admitting: Podiatry

## 2023-04-04 DIAGNOSIS — L03031 Cellulitis of right toe: Secondary | ICD-10-CM | POA: Diagnosis not present

## 2023-04-04 MED ORDER — DOXYCYCLINE HYCLATE 100 MG PO TABS: 100.0000 mg | ORAL_TABLET | Freq: Two times a day (BID) | ORAL | 1 refills | Status: DC

## 2023-04-06 ENCOUNTER — Ambulatory Visit: Payer: 59 | Attending: Cardiology | Admitting: Cardiology

## 2023-04-06 NOTE — Progress Notes (Signed)
Subjective:   Patient ID: Destiny Rocha, female   DOB: 68 y.o.   MRN: [id number]   HPI Patient presents stating she over the last week developed some slight drainage and redness where we had done an ingrown toenail a number of weeks ago.  States that it has been moderately sore   ROS      Objective:  Physical Exam  Neurovascular status intact with the patient found to have a medial border left big toe that has localized drainage it does not have proximal edema erythema noted and no systemic signs of infection     Assessment:  Localized paronychia infection left big toe nail medial border     Plan:  H&P using sterile instrumentation I did open up the medial side slight bit of drainage I flushed it out applied Band-Aid instructed on continued soaks placed on doxycycline twice daily reappoint to recheck

## 2023-06-04 NOTE — Progress Notes (Deleted)
 Cardiology Office Note    Patient Name: Destiny Rocha Date of Encounter: 06/04/2023  Primary Care Provider:  Clayborn Heron, MD Primary Cardiologist:  Tessa Lerner, DO Primary Electrophysiologist: None   Past Medical History    Past Medical History:  Diagnosis Date   Anxiety    Arthritis    Complication of anesthesia    slow to awaken   Constipation    Coronary atherosclerosis due to calcified coronary lesion    Depression    Dry eyes    Eczema    Elevated cholesterol    Family history of breast cancer    Family history of colon cancer    GERD (gastroesophageal reflux disease)    occasional   Headache    sinus   Peripheral vascular disease (HCC)    Pneumonia as child   PONV (postoperative nausea and vomiting)    Pre-diabetes    Prediabetes    Shortness of breath    Sleep disorder    Swelling of both lower extremities    Vitamin B12 deficiency    Vitamin D deficiency     History of Present Illness  Destiny Rocha is a 68 y.o. female with a PMH of coronary calcifications, HLD, OSA (not on CPAP), prediabetes, nonrheumatic MVR, obesity who presents today for overdue follow-up.  Ms. Destiny Rocha was seen initially by Dr. Odis Hollingshead on 09/2020 for complaint of shortness of breath.  She was referred by her PCP and reported experiencing shortness of breath for over 1 year.  She had a 2D echo for further evaluation that showed EF of 60 to 65% with grade 1 mitral regurgitation and no evidence of pulmonary HTN.  She completed a CTA at [facility] that showed mild coronary artery calcifications with no obstructive disease.  She was last seen by Dr. Odis Hollingshead on 03/03/2022 for 1 year follow-up.  During that time patient reported losing 13 pounds and was continued on ASA and statin.  She was also encouraged to use CPAP due to history of sleep apnea.   During today's visit the patient reports*** .  Patient denies chest pain, palpitations, dyspnea, PND, orthopnea, nausea, vomiting,  dizziness, syncope, edema, weight gain, or early satiety.  ***Notes: -Last ischemic evaluation: -Last echo: -Interim ED visits: Review of Systems  Please see the history of present illness.    All other systems reviewed and are otherwise negative except as noted above.  Physical Exam    Wt Readings from Last 3 Encounters:  03/16/23 273 lb (123.8 kg)  03/10/22 273 lb 13 oz (124.2 kg)  03/03/22 270 lb (122.5 kg)   WG:NFAOZ were no vitals filed for this visit.,There is no height or weight on file to calculate BMI. GEN: Well nourished, well developed in no acute distress Neck: No JVD; No carotid bruits Pulmonary: Clear to auscultation without rales, wheezing or rhonchi  Cardiovascular: Normal rate. Regular rhythm. Normal S1. Normal S2.   Murmurs: There is no murmur.  ABDOMEN: Soft, non-tender, non-distended EXTREMITIES:  No edema; No deformity   EKG/LABS/ Recent Cardiac Studies   ECG personally reviewed by me today - ***  Risk Assessment/Calculations:   {Does this patient have ATRIAL FIBRILLATION?:[phone]}      Lab Results  Component Value Date   WBC 8.1 03/06/2015   HGB 9.6 (L) 03/06/2015   HCT 28.4 (L) 03/06/2015   MCV 86.1 03/06/2015   PLT 203 03/06/2015   Lab Results  Component Value Date   CREATININE 0.84 03/03/2022   BUN 16  03/03/2022   NA 143 03/03/2022   K 4.4 03/03/2022   CL 103 03/03/2022   CO2 21 03/03/2022   Lab Results  Component Value Date   CHOL 187 03/03/2022   HDL 61 03/03/2022   LDLCALC 115 (H) 03/03/2022   LDLDIRECT 113 (H) 03/03/2022   TRIG 56 03/03/2022    Lab Results  Component Value Date   HGBA1C 5.9 (H) 05/07/2018   Assessment & Plan    1.  Coronary calcifications:  2.  Nonrheumatic MR:  3.  Hyperlipidemia:  4.  Shortness of breath:  5.  Obesity:      Disposition: Follow-up with Sunit Tolia, DO or APP in *** months {Are you ordering a CV Procedure (e.g. stress test, cath, DCCV, TEE, etc)?   Press F2        :[id number]}    Signed, Napoleon Form, Leodis Rains, NP 06/04/2023, 7:00 PM [facility]

## 2023-06-05 ENCOUNTER — Ambulatory Visit: Admitting: Nurse Practitioner

## 2023-06-05 DIAGNOSIS — E66813 Morbid (severe) obesity due to excess calories: Secondary | ICD-10-CM

## 2023-06-05 DIAGNOSIS — I2510 Atherosclerotic heart disease of native coronary artery without angina pectoris: Secondary | ICD-10-CM

## 2023-06-05 DIAGNOSIS — E7800 Pure hypercholesterolemia, unspecified: Secondary | ICD-10-CM

## 2023-06-05 DIAGNOSIS — G4733 Obstructive sleep apnea (adult) (pediatric): Secondary | ICD-10-CM

## 2023-06-05 DIAGNOSIS — I340 Nonrheumatic mitral (valve) insufficiency: Secondary | ICD-10-CM

## 2023-06-25 ENCOUNTER — Other Ambulatory Visit (HOSPITAL_COMMUNITY): Payer: Self-pay | Admitting: Gastroenterology

## 2023-06-25 DIAGNOSIS — R1012 Left upper quadrant pain: Secondary | ICD-10-CM

## 2023-07-06 ENCOUNTER — Ambulatory Visit (HOSPITAL_COMMUNITY)
Admission: RE | Admit: 2023-07-06 | Discharge: 2023-07-06 | Disposition: A | Discharge status: Home / Self Care | Source: Ambulatory Visit | Attending: Gastroenterology | Admitting: Gastroenterology

## 2023-07-06 DIAGNOSIS — R1012 Left upper quadrant pain: Secondary | ICD-10-CM | POA: Insufficient documentation

## 2023-07-06 MED ORDER — IOHEXOL 300 MG/ML  SOLN
100.0000 mL | Freq: Once | INTRAMUSCULAR | Status: AC | PRN
  Administered 2023-07-06: 100 mL via INTRAVENOUS

## 2023-07-06 MED ORDER — SODIUM CHLORIDE (PF) 0.9 % IJ SOLN
INTRAMUSCULAR | Status: AC
  Filled 2023-07-06: qty 50

## 2023-07-16 NOTE — Progress Notes (Signed)
 Cardiology Office Note:  .   Date:  07/30/2023  ID:  RITHIKA WYSS, DOB 1955/11/23, MRN [id number] PCP: Patient, No Pcp Per  [facility] Providers Cardiologist:  Olinda Bertrand, DO    History of Present Illness: .   Destiny Rocha is a 68 y.o. female  with mild coronary artery calcification (33.1, 80th percentile), prediabetes, HLD, OSA not on CPAP, postmenopausal female, obesity due to excess calories.  coronary CTA at [facility] health back in 2022. She was noted to have mild coronary artery calcification without any obstructive CAD.  Patient last saw Dr. Albert Huff 02/2022.   Patient comes in for regular f/u. Has lost ~40 lbs since here last on zepbound. She is trying to wean off of it. Denies chest pain, dyspnea, still not using CPAP and will need another sleep study. She walks 30 min and strength training 15-30 min 4x/week.  She's only taking simvastatin 3x/week because she was worried it would make her diabetes worse.   ROS:    Studies Reviewed: Aaron Aas    EKG Interpretation Date/Time:  Monday Jul 30 2023 08:14:39 EDT Ventricular Rate:  78 PR Interval:  162 QRS Duration:  84 QT Interval:  380 QTC Calculation: 433 R Axis:   17  Text Interpretation: Normal sinus rhythm Normal ECG Confirmed by Theotis Flake [phone]) on 07/30/2023 8:23:02 AM    Prior CV Studies:   Coronary CTA: 11/12/2020 at [facility] health, records available via Care Everywhere -  Calcium  score of 33.1.  This places the patient in the 80th percentile for age and race based on the [place] data base.  -  Within limitations of study, normal course and trajectory of coronary arteries without obstructing plaque.  -  Visualized abdomen: Focal hyperenhancement in the hepatic dome is indeterminate but may represent a flash filling hemangioma or perfusional anomaly.. Small sliding hiatal hernia.    Echocardiogram: 10/12/2020: Normal LV systolic function with visual EF 60-65%. Left ventricle cavity is normal in size.  Normal left ventricular wall thickness. Normal global wall motion. Normal diastolic filling pattern, normal LAP. Mild (Grade I) mitral regurgitation. Mild tricuspid regurgitation. No evidence of pulmonary hypertension. No prior study for comparison.    Risk Assessment/Calculations:             Physical Exam:   VS:  BP 120/82 (BP Location: Left Arm, Patient Position: Sitting, Cuff Size: Normal)   Pulse 78   Ht 5\' 7"  (1.702 m)   Wt 230 lb 9.6 oz (104.6 kg)   SpO2 100%   BMI 36.12 kg/m    Wt Readings from Last 3 Encounters:  07/30/23 230 lb 9.6 oz (104.6 kg)  03/16/23 273 lb (123.8 kg)  03/10/22 273 lb 13 oz (124.2 kg)    GEN: Obese, in no acute distress NECK: No JVD; No carotid bruits CARDIAC: RRR, 1/6 systolic murmur LSB RESPIRATORY:  Clear to auscultation without rales, wheezing or rhonchi  ABDOMEN: Soft, non-tender, non-distended EXTREMITIES:  No edema; No deformity   ASSESSMENT AND PLAN: .    Coronary atherosclerosis due to calcified coronary lesion Total coronary calcium  score 33 AU, 80th percentile. No obstructive CAD most recent coronary CTA Will check a fasting lipid profile, cmet, cbc, tsh, A1C   Nonrheumatic mitral valve regurgitation Asymptomatic. Dr. Tita Form Recommend a follow-up echo in July 2025-will order   Pure hypercholesterolemia Currently on simvastatin but only taking 3 times a week b/c she was worreid about making her diabetes worse. Will check FLP and decide on dose after  we get it back.    OSA (obstructive sleep apnea) Never got a CPAP and needs another sleep study=will order   Class 3 severe obesity due to excess calories without serious comorbidity with body mass index (BMI) of 40.0 to 44.9 in adult [facility]) Has lost 40 lbs on zepbound and feeling better. Continue 150 min exercise weekly and continued weight loss.          Dispo: f/u in 1 yr  Signed, Theotis Flake, PA-C

## 2023-07-30 ENCOUNTER — Ambulatory Visit: Attending: Physician Assistant | Admitting: Physician Assistant

## 2023-07-30 ENCOUNTER — Other Ambulatory Visit: Payer: Self-pay

## 2023-07-30 ENCOUNTER — Encounter: Payer: Self-pay | Admitting: Physician Assistant

## 2023-07-30 VITALS — BP 120/82 | HR 78 | Ht 67.0 in | Wt 230.6 lb

## 2023-07-30 DIAGNOSIS — I2584 Coronary atherosclerosis due to calcified coronary lesion: Secondary | ICD-10-CM

## 2023-07-30 DIAGNOSIS — I340 Nonrheumatic mitral (valve) insufficiency: Secondary | ICD-10-CM

## 2023-07-30 DIAGNOSIS — Z6841 Body Mass Index (BMI) 40.0 and over, adult: Secondary | ICD-10-CM

## 2023-07-30 DIAGNOSIS — E7800 Pure hypercholesterolemia, unspecified: Secondary | ICD-10-CM

## 2023-07-30 DIAGNOSIS — I2510 Atherosclerotic heart disease of native coronary artery without angina pectoris: Secondary | ICD-10-CM

## 2023-07-30 DIAGNOSIS — G4733 Obstructive sleep apnea (adult) (pediatric): Secondary | ICD-10-CM

## 2023-07-30 DIAGNOSIS — E66813 Obesity, class 3: Secondary | ICD-10-CM

## 2023-07-30 LAB — HEMOGLOBIN A1C
Est. average glucose Bld gHb Est-mCnc: 114 mg/dL
Hgb A1c MFr Bld: 5.6 % (ref 4.8–5.6)

## 2023-07-30 LAB — LIPID PANEL

## 2023-07-30 NOTE — Patient Instructions (Signed)
 Medication Instructions:  Your physician recommends that you continue on your current medications as directed. Please refer to the Current Medication list given to you today.  *If you need a refill on your cardiac medications before your next appointment, please call your pharmacy*  Lab Work: TODAY: CMET, LIPIDS, CBC, TSH, A1C If you have labs (blood work) drawn today and your tests are completely normal, you will receive your results only by: MyChart Message (if you have MyChart) OR A paper copy in the mail If you have any lab test that is abnormal or we need to change your treatment, we will call you to review the results.  Testing/Procedures: Your physician has requested that you have an echocardiogram. Echocardiography is a painless test that uses sound waves to create images of your heart. It provides your doctor with information about the size and shape of your heart and how well your heart's chambers and valves are working. This procedure takes approximately one hour. There are no restrictions for this procedure. Please do NOT wear cologne, perfume, aftershave, or lotions (deodorant is allowed). Please arrive 15 minutes prior to your appointment time.  Your physician has recommended that you have a sleep study. This test records several body functions during sleep, including: brain activity, eye movement, oxygen and carbon dioxide blood levels, heart rate and rhythm, breathing rate and rhythm, the flow of air through your mouth and nose, snoring, body muscle movements, and chest and belly movement.   Please note: We ask at that you not bring children with you during ultrasound (echo/ vascular) testing. Due to room size and safety concerns, children are not allowed in the ultrasound rooms during exams. Our front office staff cannot provide observation of children in our lobby area while testing is being conducted. An adult accompanying a patient to their appointment will only be allowed in  the ultrasound room at the discretion of the ultrasound technician under special circumstances. We apologize for any inconvenience.   Follow-Up: At [facility], you and your health needs are our priority.  As part of our continuing mission to provide you with exceptional heart care, our providers are all part of one team.  This team includes your primary Cardiologist (physician) and Advanced Practice Providers or APPs (Physician Assistants and Nurse Practitioners) who all work together to provide you with the care you need, when you need it.  Your next appointment:   1 year(s)  Provider:   Olinda Bertrand, DO   We recommend signing up for the patient portal called "MyChart".  Sign up information is provided on this After Visit Summary.  MyChart is used to connect with patients for Virtual Visits (Telemedicine).  Patients are able to view lab/test results, encounter notes, upcoming appointments, etc.  Non-urgent messages can be sent to your provider as well.   To learn more about what you can do with MyChart, go to [email or web address].   Other Instructions

## 2023-07-31 ENCOUNTER — Ambulatory Visit: Payer: Self-pay | Admitting: Physician Assistant

## 2023-07-31 DIAGNOSIS — E7800 Pure hypercholesterolemia, unspecified: Secondary | ICD-10-CM

## 2023-07-31 LAB — CBC
Hematocrit: 40.2 % (ref 34.0–46.6)
Hemoglobin: 13.1 g/dL (ref 11.1–15.9)
MCH: 29 pg (ref 26.6–33.0)
MCHC: 32.6 g/dL (ref 31.5–35.7)
MCV: 89 fL (ref 79–97)
Platelets: 260 10*3/uL (ref 150–450)
RBC: 4.51 x10E6/uL (ref 3.77–5.28)
RDW: 13.5 % (ref 11.7–15.4)
WBC: 6.2 10*3/uL (ref 3.4–10.8)

## 2023-07-31 LAB — COMPREHENSIVE METABOLIC PANEL WITH GFR
ALT: 22 IU/L (ref 0–32)
AST: 18 IU/L (ref 0–40)
Albumin: 4.3 g/dL (ref 3.9–4.9)
Alkaline Phosphatase: 85 IU/L (ref 44–121)
BUN/Creatinine Ratio: 19 (ref 12–28)
BUN: 16 mg/dL (ref 8–27)
Bilirubin Total: 0.6 mg/dL (ref 0.0–1.2)
CO2: 23 mmol/L (ref 20–29)
Calcium: 9 mg/dL (ref 8.7–10.3)
Chloride: 104 mmol/L (ref 96–106)
Creatinine, Ser: 0.84 mg/dL (ref 0.57–1.00)
Globulin, Total: 2.6 g/dL (ref 1.5–4.5)
Glucose: 83 mg/dL (ref 70–99)
Potassium: 3.8 mmol/L (ref 3.5–5.2)
Sodium: 140 mmol/L (ref 134–144)
Total Protein: 6.9 g/dL (ref 6.0–8.5)
eGFR: 77 mL/min/{1.73_m2} (ref >59)

## 2023-07-31 LAB — LIPID PANEL
Cholesterol, Total: 186 mg/dL (ref 100–199)
HDL: 61 mg/dL (ref >39)
LDL CALC COMMENT:: 3 ratio (ref 0.0–4.4)
LDL Chol Calc (NIH): 116 mg/dL — ABNORMAL HIGH (ref 0–99)
Triglycerides: 48 mg/dL (ref 0–149)
VLDL Cholesterol Cal: 9 mg/dL (ref 5–40)

## 2023-07-31 LAB — TSH: TSH: 2.17 u[IU]/mL (ref 0.450–4.500)

## 2023-08-01 ENCOUNTER — Telehealth: Payer: Self-pay | Admitting: Physician Assistant

## 2023-08-01 NOTE — Telephone Encounter (Signed)
 Pt is returning CMA call Destiny Rocha and is requesting a callback. Please advise

## 2023-08-02 MED ORDER — SIMVASTATIN 40 MG PO TABS: 40.0000 mg | ORAL_TABLET | Freq: Every day | ORAL | 3 refills | Status: AC

## 2023-08-02 NOTE — Telephone Encounter (Signed)
 Reviewed results with patient who verbalized understanding. Patient states she would like the simvastatin 40 mg every day be sent to [facility]. I have sent Rx to requested pharmacy and placed orders for FLP in 3 months. Patient thanked me for the call.

## 2023-08-02 NOTE — Telephone Encounter (Signed)
-----   Message from Nurse Aneta Bar sent at 07/31/2023  8:05 AM EDT -----  ----- Message ----- From: Flo Hummingbird, PA-C Sent: 07/31/2023   7:44 AM EDT To: [facility] Triage  Hgb A1C normal at 5.6 and better than 5 years ago. Not sure why this resulted separetly from other labs.

## 2023-09-07 ENCOUNTER — Ambulatory Visit (HOSPITAL_COMMUNITY)
Admission: RE | Admit: 2023-09-07 | Discharge: 2023-09-07 | Disposition: A | Discharge status: Home / Self Care | Source: Ambulatory Visit | Attending: Physician Assistant | Admitting: Physician Assistant

## 2023-09-07 DIAGNOSIS — I340 Nonrheumatic mitral (valve) insufficiency: Secondary | ICD-10-CM

## 2023-09-07 LAB — ECHOCARDIOGRAM COMPLETE: S' Lateral: 2.96 cm

## 2023-12-14 ENCOUNTER — Encounter: Payer: Self-pay | Admitting: Podiatry

## 2023-12-14 ENCOUNTER — Ambulatory Visit (INDEPENDENT_AMBULATORY_CARE_PROVIDER_SITE_OTHER): Admitting: Podiatry

## 2023-12-14 DIAGNOSIS — L600 Ingrowing nail: Secondary | ICD-10-CM | POA: Diagnosis not present

## 2023-12-14 NOTE — Patient Instructions (Signed)

## 2023-12-14 NOTE — Progress Notes (Signed)
 Subjective:   Patient ID: Destiny Rocha, female   DOB: 67 y.o.   MRN: [id number]   HPI Patient presents stating the second nail on the left foot has thickened and its ingrown and becoming increasingly painful.  Patient states that she cannot cut it anymore and it can bleed at times   ROS      Objective:  Physical Exam  Neurovascular status intact with severely deformed thickened dystrophic second nail left foot that is painful from a dorsal direction     Assessment:  Chronic structural damage second nailbed left     Plan:  H&P reviewed condition discussed treatment options patient has opted for surgical intervention and I explained permanent removal of allowing her to read consent form.  Patient wants surgery and at this time I anesthetized the second toe left with 60 mg Xylocaine  Marcaine  mixture sterile prep done and using sterile instrumentation removed the second nail exposed matrix and applied phenol for applications 30 seconds followed by alcohol  lavage sterile dressing gave instructions on soaks wear dressing 24 hours to get up earlier if throbbing were to occur and encouraged her to contact if any issues were to occur during the healing process

## 2023-12-21 ENCOUNTER — Telehealth: Payer: Self-pay | Admitting: Lab

## 2023-12-21 NOTE — Telephone Encounter (Signed)
 Patient is calling states had ingrown procedure and now has signs of infection swelling of toe,redness and drainage would like an antibiotic called in.

## 2023-12-24 ENCOUNTER — Other Ambulatory Visit: Payer: Self-pay | Admitting: Lab

## 2023-12-24 MED ORDER — DOXYCYCLINE HYCLATE 100 MG PO TABS: 100.0000 mg | ORAL_TABLET | Freq: Two times a day (BID) | ORAL | 1 refills | Status: AC

## 2024-01-03 ENCOUNTER — Encounter: Payer: Self-pay | Admitting: Podiatry

## 2024-01-03 ENCOUNTER — Ambulatory Visit (INDEPENDENT_AMBULATORY_CARE_PROVIDER_SITE_OTHER): Admitting: Podiatry

## 2024-01-03 VITALS — Ht 67.0 in | Wt 230.6 lb

## 2024-01-03 DIAGNOSIS — L03032 Cellulitis of left toe: Secondary | ICD-10-CM | POA: Diagnosis not present

## 2024-01-04 NOTE — Progress Notes (Signed)
 Subjective:   Patient ID: Destiny Rocha, female   DOB: 68 y.o.   MRN: [id number]   HPI Patient presents concerned about the second nail left states it feels like it has has some swelling and it still tender   ROS      Objective:  Physical Exam  Neurovascular status intact with inflammation of the second digit left nailbed is localized with no proximal edema erythema drainage noted and mild tenderness     Assessment:  May be related to an inflammatory reaction or to a mild localized paronychia with no active drainage noted     Plan:  H&P reviewed continue soaks bandage usage and I applied cushion to the toe to offload weight.  This should be uneventful but I want her to watch it and if it gives her trouble she is to let me know right away or any drainage or redness proximal or to occur
